# Patient Record
Sex: Female | Born: 1980 | Race: Black or African American | Hispanic: No | Marital: Single | Smoking: Former smoker
Health system: Southern US, Community
[De-identification: ages and names within clinical notes are randomized; demographics above are authoritative.]

## PROBLEM LIST (undated history)

## (undated) ENCOUNTER — Emergency Department: Admission: EM | Payer: Medicaid Other | Source: Home / Self Care

## (undated) DIAGNOSIS — F419 Anxiety disorder, unspecified: Secondary | ICD-10-CM

## (undated) DIAGNOSIS — E042 Nontoxic multinodular goiter: Secondary | ICD-10-CM

## (undated) DIAGNOSIS — I10 Essential (primary) hypertension: Secondary | ICD-10-CM

## (undated) DIAGNOSIS — D649 Anemia, unspecified: Secondary | ICD-10-CM

## (undated) DIAGNOSIS — J45909 Unspecified asthma, uncomplicated: Secondary | ICD-10-CM

## (undated) DIAGNOSIS — N3281 Overactive bladder: Secondary | ICD-10-CM

## (undated) DIAGNOSIS — E041 Nontoxic single thyroid nodule: Secondary | ICD-10-CM

## (undated) DIAGNOSIS — R3915 Urgency of urination: Secondary | ICD-10-CM

## (undated) HISTORY — PX: ABDOMINAL HYSTERECTOMY: SHX81

## (undated) HISTORY — PX: OTHER SURGICAL HISTORY: SHX169

---

## 2012-07-14 DIAGNOSIS — N9981 Other intraoperative complications of genitourinary system: Secondary | ICD-10-CM | POA: Insufficient documentation

## 2012-09-03 DIAGNOSIS — J45909 Unspecified asthma, uncomplicated: Secondary | ICD-10-CM | POA: Insufficient documentation

## 2015-05-09 DIAGNOSIS — F411 Generalized anxiety disorder: Secondary | ICD-10-CM | POA: Insufficient documentation

## 2015-05-09 DIAGNOSIS — I10 Essential (primary) hypertension: Secondary | ICD-10-CM | POA: Insufficient documentation

## 2017-08-01 DIAGNOSIS — Z6841 Body Mass Index (BMI) 40.0 and over, adult: Secondary | ICD-10-CM | POA: Insufficient documentation

## 2017-09-25 DIAGNOSIS — M654 Radial styloid tenosynovitis [de Quervain]: Secondary | ICD-10-CM | POA: Insufficient documentation

## 2018-06-25 DIAGNOSIS — M778 Other enthesopathies, not elsewhere classified: Secondary | ICD-10-CM | POA: Insufficient documentation

## 2018-12-04 ENCOUNTER — Emergency Department: Payer: Managed Care, Other (non HMO)

## 2018-12-04 DIAGNOSIS — J45909 Unspecified asthma, uncomplicated: Secondary | ICD-10-CM | POA: Insufficient documentation

## 2018-12-04 DIAGNOSIS — J029 Acute pharyngitis, unspecified: Secondary | ICD-10-CM | POA: Diagnosis present

## 2018-12-04 DIAGNOSIS — I10 Essential (primary) hypertension: Secondary | ICD-10-CM | POA: Diagnosis not present

## 2018-12-04 DIAGNOSIS — J208 Acute bronchitis due to other specified organisms: Secondary | ICD-10-CM | POA: Insufficient documentation

## 2018-12-04 DIAGNOSIS — J358 Other chronic diseases of tonsils and adenoids: Secondary | ICD-10-CM | POA: Insufficient documentation

## 2018-12-04 DIAGNOSIS — Z87891 Personal history of nicotine dependence: Secondary | ICD-10-CM | POA: Diagnosis not present

## 2018-12-04 LAB — CBC
HCT: 39.8 % (ref 36.0–46.0)
Hemoglobin: 13.1 g/dL (ref 12.0–15.0)
MCH: 30.5 pg (ref 26.0–34.0)
MCHC: 32.9 g/dL (ref 30.0–36.0)
MCV: 92.6 fL (ref 80.0–100.0)
Platelets: 203 10*3/uL (ref 150–400)
RBC: 4.3 MIL/uL (ref 3.87–5.11)
RDW: 12.1 % (ref 11.5–15.5)
WBC: 8 10*3/uL (ref 4.0–10.5)
nRBC: 0 % (ref 0.0–0.2)

## 2018-12-04 LAB — TROPONIN I: Troponin I: <0.03 ng/mL (ref <0.03)

## 2018-12-04 LAB — BASIC METABOLIC PANEL
Anion gap: 8 (ref 5–15)
BUN: 12 mg/dL (ref 6–20)
CO2: 24 mmol/L (ref 22–32)
Calcium: 8.7 mg/dL — ABNORMAL LOW (ref 8.9–10.3)
Chloride: 106 mmol/L (ref 98–111)
Creatinine, Ser: 0.68 mg/dL (ref 0.44–1.00)
GFR calc Af Amer: >60 mL/min (ref >60)
GFR calc non Af Amer: >60 mL/min (ref >60)
GLUCOSE: 104 mg/dL — AB (ref 70–99)
Potassium: 3.2 mmol/L — ABNORMAL LOW (ref 3.5–5.1)
Sodium: 138 mmol/L (ref 135–145)

## 2018-12-04 LAB — GROUP A STREP BY PCR: Group A Strep by PCR: NOT DETECTED

## 2018-12-04 MED ORDER — SODIUM CHLORIDE 0.9% FLUSH: 3.0000 mL | Freq: Once | INTRAVENOUS | Status: DC

## 2018-12-04 NOTE — ED Triage Notes (Signed)
Patient c/o sore throat and SOB beginning today.

## 2018-12-05 ENCOUNTER — Emergency Department
Admission: EM | Admit: 2018-12-05 | Discharge: 2018-12-05 | Disposition: A | Discharge status: Home / Self Care | Payer: Managed Care, Other (non HMO) | Attending: Emergency Medicine | Admitting: Emergency Medicine

## 2018-12-05 DIAGNOSIS — J208 Acute bronchitis due to other specified organisms: Secondary | ICD-10-CM | POA: Diagnosis not present

## 2018-12-05 DIAGNOSIS — J358 Other chronic diseases of tonsils and adenoids: Secondary | ICD-10-CM

## 2018-12-05 HISTORY — DX: Essential (primary) hypertension: I10

## 2018-12-05 HISTORY — DX: Unspecified asthma, uncomplicated: J45.909

## 2018-12-05 MED ORDER — ACETAMINOPHEN 500 MG PO TABS
1000.0000 mg | ORAL_TABLET | Freq: Once | ORAL | Status: AC
  Administered 2018-12-05: 1000 mg via ORAL
  Filled 2018-12-05: qty 2

## 2018-12-05 MED ORDER — ALBUTEROL SULFATE HFA 108 (90 BASE) MCG/ACT IN AERS: 2.0000 | INHALATION_SPRAY | Freq: Four times a day (QID) | RESPIRATORY_TRACT | 0 refills | Status: DC | PRN

## 2018-12-05 MED ORDER — IPRATROPIUM-ALBUTEROL 0.5-2.5 (3) MG/3ML IN SOLN
3.0000 mL | Freq: Once | RESPIRATORY_TRACT | Status: AC
  Administered 2018-12-05: 3 mL via RESPIRATORY_TRACT
  Filled 2018-12-05: qty 3

## 2018-12-05 MED ORDER — DEXAMETHASONE 10 MG/ML FOR PEDIATRIC ORAL USE
10.0000 mg | Freq: Once | INTRAMUSCULAR | Status: AC
  Administered 2018-12-05: 10 mg via ORAL
  Filled 2018-12-05: qty 1

## 2018-12-05 MED ORDER — OXYMETAZOLINE HCL 0.05 % NA SOLN
1.0000 | Freq: Once | NASAL | Status: AC
  Administered 2018-12-05: 1 via NASAL
  Filled 2018-12-05: qty 15

## 2018-12-05 NOTE — ED Provider Notes (Signed)
[facility] Emergency Department Provider Note  ____________________________________________   First MD Initiated Contact with Patient 12/05/18 0041     (approximate)  I have reviewed the triage vital signs and the nursing notes.   HISTORY  Chief Complaint Sore Throat and Shortness of Breath   HPI Mary Hicks is a 38 y.o. female who self presents to the emergency department with sore throat and shortness of breath that began gradually earlier today.  She is able to eat and drink although she does have some discomfort with swallowing.  No fevers or chills.  No chest pain.  Shortness of breath is mildly exertional and somewhat improved with rest.  She does have a dry cough.  She also has a history of asthma although has never been intubated.  She does not currently smoke.  No nasal congestion.  No ear pain.  Her symptoms are currently mild to moderate.    Past Medical History:  Diagnosis Date  . Asthma   . Hypertension     There are no active problems to display for this patient.   Past Surgical History:  Procedure Laterality Date  . ABDOMINAL HYSTERECTOMY    . CESAREAN SECTION      Prior to Admission medications   Medication Sig Start Date End Date Taking? Authorizing Provider  albuterol (PROVENTIL HFA;VENTOLIN HFA) 108 (90 Base) MCG/ACT inhaler Inhale 2 puffs into the lungs every 6 (six) hours as needed for wheezing or shortness of breath. 12/05/18   Merrily Brittleifenbark, Maricruz Lucero, MD    Allergies Patient has no known allergies.  No family history on file.  Social History Social History   Tobacco Use  . Smoking status: Former Games developermoker  . Smokeless tobacco: Never Used  Substance Use Topics  . Alcohol use: Yes  . Drug use: Not on file    Review of Systems Constitutional: No fever/chills Eyes: No visual changes. ENT: Positive for sore throat. Cardiovascular: Denies chest pain. Respiratory: Positive for shortness of breath. Gastrointestinal: No  abdominal pain.  No nausea, no vomiting.  No diarrhea.  No constipation. Genitourinary: Negative for dysuria. Musculoskeletal: Negative for back pain. Skin: Negative for rash. Neurological: Negative for headaches, focal weakness or numbness.   ____________________________________________   PHYSICAL EXAM:  VITAL SIGNS: ED Triage Vitals  Enc Vitals Group     BP 12/04/18 2052 (!) 174/99     Pulse Rate 12/04/18 2052 83     Resp 12/04/18 2052 17     Temp 12/04/18 2052 98.5 F (36.9 C)     Temp Source 12/04/18 2052 Oral     SpO2 12/04/18 2052 98 %     Weight 12/04/18 2053 240 lb (108.9 kg)     Height 12/04/18 2053 5' 7.5" (1.715 m)     Head Circumference --      Peak Flow --      Pain Score 12/04/18 2053 6     Pain Loc --      Pain Edu? --      Excl. in GC? --     Constitutional: Alert and oriented x4 speaks in full clear sentences no diaphoresis Eyes: PERRL EOMI. Head: Atraumatic. Nose: No congestion/rhinnorhea. Mouth/Throat: No trismus uvula midline no pharyngeal exudate mild erythema and she does have a tonsil stone on the left Neck: No stridor.  No anterior lymphadenopathy appreciated Cardiovascular: Normal rate, regular rhythm. Grossly normal heart sounds.  Good peripheral circulation. Respiratory: Normal respiratory effort.  No retractions.  Mild expiratory wheeze throughout although moving  good air Gastrointestinal: Soft nontender Musculoskeletal: No lower extremity edema   Neurologic:  Normal speech and language. No gross focal neurologic deficits are appreciated. Skin:  Skin is warm, dry and intact. No rash noted. Psychiatric: Mood and affect are normal. Speech and behavior are normal.    ____________________________________________   DIFFERENTIAL includes but not limited to  Bronchitis, viral pharyngitis, strep pharyngitis, retropharyngeal abscess, pneumonia, asthma exacerbation ____________________________________________   LABS (all labs ordered are  listed, but only abnormal results are displayed)  Labs Reviewed  BASIC METABOLIC PANEL - Abnormal; Notable for the following components:      Result Value   Potassium 3.2 (*)    Glucose, Bld 104 (*)    Calcium 8.7 (*)    All other components within normal limits  GROUP A STREP BY PCR  CBC  TROPONIN I    Lab work reviewed by me with no acute disease __________________________________________  EKG  ED ECG REPORT I, Merrily BrittleNeil Maniah Nading, the attending physician, personally viewed and interpreted this ECG.  Date: 12/07/2018 EKG Time:  Rate: 87 Rhythm: normal sinus rhythm QRS Axis: normal Intervals: normal ST/T Wave abnormalities: normal Narrative Interpretation: no evidence of acute ischemia  ____________________________________________  RADIOLOGY  Chest x-ray reviewed by me with no acute disease ____________________________________________   PROCEDURES  Procedure(s) performed: no  Procedures  Critical Care performed: no  ____________________________________________   INITIAL IMPRESSION / ASSESSMENT AND PLAN / ED COURSE  Pertinent labs & imaging results that were available during my care of the patient were reviewed by me and considered in my medical decision making (see chart for details).   As part of my medical decision making, I reviewed the following data within the electronic MEDICAL RECORD NUMBER History obtained from family if available, nursing notes, old chart and ekg, as well as notes from prior ED visits.  Patient comes to the emergency department with less than 24 hours of dry cough mild shortness of breath and sore throat.  Her lab work is reassuring and she is strep negative.  She is somewhat wheezy with a history of asthma and I have given her a breathing treatment along with Afrin to help with any congestion and postnasal drip with improvement in her symptoms.  I have also given her a dose of dexamethasone here to help with any mild asthma exacerbation versus  sore throat.  She feels significantly improved.  Will be discharged home with a refill for her albuterol and strict return precautions.      ____________________________________________   FINAL CLINICAL IMPRESSION(S) / ED DIAGNOSES  Final diagnoses:  Viral bronchitis  Tonsil stone      NEW MEDICATIONS STARTED DURING THIS VISIT:  Discharge Medication List as of 12/05/2018  1:16 AM    START taking these medications   Details  albuterol (PROVENTIL HFA;VENTOLIN HFA) 108 (90 Base) MCG/ACT inhaler Inhale 2 puffs into the lungs every 6 (six) hours as needed for wheezing or shortness of breath., Starting Fri 12/05/2018, Print         Note:  This document was prepared using Dragon voice recognition software and may include unintentional dictation errors.    Merrily Brittleifenbark, Mary Bolar, MD 12/07/18 2215

## 2018-12-05 NOTE — Discharge Instructions (Signed)
It's normal for you to be sick for 3-4 days with this illness.  Please take ibuprofen and tylenol as needed for fevers or pain and use your inhaler if you feel short of breath.    Return to the ED for any concerns.  It was a pleasure to take care of you today, and thank you for coming to our emergency department.  If you have any questions or concerns before leaving please ask the nurse to grab me and I'm more than happy to go through your aftercare instructions again.  If you have any concerns once you are home that you are not improving or are in fact getting worse before you can make it to your follow-up appointment, please do not hesitate to call [phone] and come back for further evaluation.  Merrily Brittle, MD  Results for orders placed or performed during the hospital encounter of 12/05/18  Group A Strep by PCR ([facility] Only)  Result Value Ref Range   Group A Strep by PCR NOT DETECTED NOT DETECTED  Basic metabolic panel  Result Value Ref Range   Sodium 138 135 - 145 mmol/L   Potassium 3.2 (L) 3.5 - 5.1 mmol/L   Chloride 106 98 - 111 mmol/L   CO2 24 22 - 32 mmol/L   Glucose, Bld 104 (H) 70 - 99 mg/dL   BUN 12 6 - 20 mg/dL   Creatinine, Ser 6.38 0.44 - 1.00 mg/dL   Calcium 8.7 (L) 8.9 - 10.3 mg/dL   GFR calc non Af Amer >60 >60 mL/min   GFR calc Af Amer >60 >60 mL/min   Anion gap 8 5 - 15  CBC  Result Value Ref Range   WBC 8.0 4.0 - 10.5 K/uL   RBC 4.30 3.87 - 5.11 MIL/uL   Hemoglobin 13.1 12.0 - 15.0 g/dL   HCT 93.7 34.2 - 87.6 %   MCV 92.6 80.0 - 100.0 fL   MCH 30.5 26.0 - 34.0 pg   MCHC 32.9 30.0 - 36.0 g/dL   RDW 81.1 57.2 - 62.0 %   Platelets 203 150 - 400 K/uL   nRBC 0.0 0.0 - 0.2 %  Troponin I - ONCE - STAT  Result Value Ref Range   Troponin I <0.03 <0.03 ng/mL   Dg Chest 2 View  Result Date: 12/04/2018 CLINICAL DATA:  38 year old female with shortness of breath. EXAM: CHEST - 2 VIEW COMPARISON:  None. FINDINGS: The heart size and mediastinal contours are within  normal limits. Both lungs are clear. The visualized skeletal structures are unremarkable. IMPRESSION: No active cardiopulmonary disease. Electronically Signed   By: Elgie Collard M.D.   On: 12/04/2018 21:21

## 2019-02-10 ENCOUNTER — Telehealth: Payer: Self-pay | Admitting: Emergency Medicine

## 2019-02-10 ENCOUNTER — Emergency Department
Admission: EM | Admit: 2019-02-10 | Discharge: 2019-02-10 | Disposition: A | Discharge status: Home / Self Care | Payer: Managed Care, Other (non HMO) | Attending: Emergency Medicine | Admitting: Emergency Medicine

## 2019-02-10 ENCOUNTER — Encounter: Payer: Self-pay | Admitting: Emergency Medicine

## 2019-02-10 DIAGNOSIS — Z87891 Personal history of nicotine dependence: Secondary | ICD-10-CM | POA: Insufficient documentation

## 2019-02-10 DIAGNOSIS — Z8709 Personal history of other diseases of the respiratory system: Secondary | ICD-10-CM

## 2019-02-10 DIAGNOSIS — J029 Acute pharyngitis, unspecified: Secondary | ICD-10-CM | POA: Insufficient documentation

## 2019-02-10 DIAGNOSIS — I10 Essential (primary) hypertension: Secondary | ICD-10-CM | POA: Diagnosis not present

## 2019-02-10 DIAGNOSIS — R062 Wheezing: Secondary | ICD-10-CM | POA: Diagnosis present

## 2019-02-10 DIAGNOSIS — J45909 Unspecified asthma, uncomplicated: Secondary | ICD-10-CM | POA: Diagnosis not present

## 2019-02-10 LAB — GROUP A STREP BY PCR: Group A Strep by PCR: DETECTED — AB

## 2019-02-10 MED ORDER — ALBUTEROL SULFATE HFA 108 (90 BASE) MCG/ACT IN AERS
2.0000 | INHALATION_SPRAY | Freq: Four times a day (QID) | RESPIRATORY_TRACT | Status: DC
  Administered 2019-02-10: 2 via RESPIRATORY_TRACT
  Filled 2019-02-10: qty 6.7

## 2019-02-10 MED ORDER — PENICILLIN G BENZATHINE 1200000 UNIT/2ML IM SUSP
2.4000 10*6.[IU] | Freq: Once | INTRAMUSCULAR | Status: AC
  Administered 2019-02-10: 2.4 10*6.[IU] via INTRAMUSCULAR
  Filled 2019-02-10: qty 4

## 2019-02-10 MED ORDER — CEFTRIAXONE SODIUM 250 MG IJ SOLR
250.0000 mg | Freq: Once | INTRAMUSCULAR | Status: AC
  Administered 2019-02-10: 250 mg via INTRAMUSCULAR
  Filled 2019-02-10: qty 250

## 2019-02-10 MED ORDER — PHENOL 1.4 % MT LIQD
1.0000 | OROMUCOSAL | Status: DC | PRN
  Administered 2019-02-10: 1 via OROMUCOSAL
  Filled 2019-02-10: qty 177

## 2019-02-10 MED ORDER — LIDOCAINE HCL (PF) 1 % IJ SOLN
INTRAMUSCULAR | Status: AC
  Administered 2019-02-10: 0.9 mL
  Filled 2019-02-10: qty 5

## 2019-02-10 MED ORDER — AZITHROMYCIN 500 MG PO TABS
1000.0000 mg | ORAL_TABLET | Freq: Once | ORAL | Status: AC
  Administered 2019-02-10: 1000 mg via ORAL
  Filled 2019-02-10: qty 2

## 2019-02-10 MED ORDER — MENTHOL 3 MG MT LOZG
1.0000 | LOZENGE | OROMUCOSAL | Status: DC | PRN
  Administered 2019-02-10: 3 mg via ORAL
  Filled 2019-02-10: qty 9

## 2019-02-10 NOTE — Telephone Encounter (Addendum)
Called patient to inform of positive strep a test.  She was treated with IM penicillin.  Phone is busy.   Will try one more time later. Tried again to call and still says busy.

## 2019-02-10 NOTE — Discharge Instructions (Signed)
Use the inhaler 2 puffs 4 times a day as needed the Cepacol lozenges you can take up to 3 times a day for the sore throat just suck on them and is Chloraseptic Spray follow the directions on the bottle sprayed in the back of your throat to help with the sore throat.  Please return here if you are worse or not any better in 2 or 3 days.

## 2019-02-10 NOTE — ED Provider Notes (Addendum)
[facility] [place] Emergency Department Provider Note   ____________________________________________   First MD Initiated Contact with Patient 02/10/19 0025     (approximate)  I have reviewed the triage vital signs and the nursing notes.   HISTORY  Chief Complaint Sore Throat   HPI Mary Hicks is a 38 y.o. female is in complaining of running out of her inhaler which is albuterol and having some trouble with wheezing and having a sore throat which is keeping her from sleeping.  On reviewing her old records I noticed that she had been treated for gonorrhea and syphilis last year.  I asked her if she had had any oral sex she said no which she said she had been with a gentleman since then he had not been treated since he had stage IV cancer and she needed treatment again.  I will give her the same treatment she had last time which was Bicillin 2,400,000 units IM with 250 Rocephin and a gram of Zithromax.  Additionally I will give her strep test and Cepacol lozenges and Chloraseptic spray.  I will also give her an albuterol inhaler.  Patient denies having had any oral exposure.         Past Medical History:  Diagnosis Date  . Asthma   . Hypertension     There are no active problems to display for this patient.   Past Surgical History:  Procedure Laterality Date  . ABDOMINAL HYSTERECTOMY    . CESAREAN SECTION      Prior to Admission medications   Medication Sig Start Date End Date Taking? Authorizing Provider  albuterol (PROVENTIL HFA;VENTOLIN HFA) 108 (90 Base) MCG/ACT inhaler Inhale 2 puffs into the lungs every 6 (six) hours as needed for wheezing or shortness of breath. 12/05/18   Merrily Brittle, MD    Allergies Patient has no known allergies.  History reviewed. No pertinent family history.  Social History Social History   Tobacco Use  . Smoking status: Former Games developer  . Smokeless tobacco: Never Used  Substance Use Topics  . Alcohol use: Yes   . Drug use: Not on file    Review of Systems  Constitutional: No fever/chills Eyes: No visual changes. ENT: No sore throat. Cardiovascular: Denies chest pain. Respiratory: Denies shortness of breath. Gastrointestinal: No abdominal pain.  No nausea, no vomiting.  No diarrhea.  No constipation. Genitourinary: Negative for dysuria. Musculoskeletal: Negative for back pain. Skin: Negative for rash. Psychiatry: Normal mood  ____________________________________________   PHYSICAL EXAM:  VITAL SIGNS: ED Triage Vitals [02/10/19 0023]  Enc Vitals Group     BP (!) 156/105     Pulse Rate 98     Resp 20     Temp 98.2 F (36.8 C)     Temp Source Oral     SpO2 100 %     Weight      Height      Head Circumference      Peak Flow      Pain Score      Pain Loc      Pain Edu?      Excl. in GC?     Constitutional: Alert and oriented. Well appearing and in no acute distress. Eyes: Conjunctivae are normal. PERRL. EOMI. Head: Atraumatic. Nose: No congestion/rhinnorhea. Mouth/Throat: Mucous membranes are moist.  Oropharynx mildly erythematous.  No pus I do not see any tonsil stones which patient complained of Neck: No stridor.  No cervical adenopathy no tenderness at all in the  neck  cardiovascular: Normal rate, regular rhythm. Grossly normal heart sounds.  Good peripheral circulation. Respiratory: Normal respiratory effort.  No retractions. Lungs CTAB. Gastrointestinal: Soft and nontender. No distention. No abdominal bruits. No CVA tenderness. Musculoskeletal: No lower extremity tenderness nor edema.   Neurologic:  Normal speech and language. No gross focal neurologic deficits are appreciated. No gait instability. Skin:  Skin is warm, dry and intact. No rash noted.   ____________________________________________   LABS (all labs ordered are listed, but only abnormal results are displayed)  Labs Reviewed - No data to display ____________________________________________  EKG    ____________________________________________  RADIOLOGY  ED MD interpretation:    Official radiology report(s): No results found.  ____________________________________________   PROCEDURES  Procedure(s) performed (including Critical Care):  Procedures   ____________________________________________   INITIAL IMPRESSION / ASSESSMENT AND PLAN / ED COURSE  Advised patient to get her partner treated.  She has no wheezing now the albuterol should help in the future.               ____________________________________________   FINAL CLINICAL IMPRESSION(S) / ED DIAGNOSES  Final diagnoses:  History of asthma  Pharyngitis, unspecified etiology     ED Discharge Orders    None       Note:  This document was prepared using Dragon voice recognition software and may include unintentional dictation errors.    Arnaldo Natal, MD 02/10/19 7915    Arnaldo Natal, MD 02/10/19 [phone]

## 2019-02-10 NOTE — ED Triage Notes (Signed)
Pt c/o sore throat and wheezing x3 days. Pt sts she has tonsil stones and they make her tonsils swell, causing her throat to hurt. Pt out of inhaler and reports wheezing. No wheezing on assessment.

## 2019-03-28 ENCOUNTER — Other Ambulatory Visit: Payer: Self-pay

## 2019-03-28 ENCOUNTER — Encounter: Payer: Self-pay | Admitting: Emergency Medicine

## 2019-03-28 ENCOUNTER — Emergency Department
Admission: EM | Admit: 2019-03-28 | Discharge: 2019-03-28 | Disposition: A | Discharge status: Home / Self Care | Payer: Medicaid Other | Attending: Emergency Medicine | Admitting: Emergency Medicine

## 2019-03-28 DIAGNOSIS — J358 Other chronic diseases of tonsils and adenoids: Secondary | ICD-10-CM | POA: Diagnosis not present

## 2019-03-28 DIAGNOSIS — Z202 Contact with and (suspected) exposure to infections with a predominantly sexual mode of transmission: Secondary | ICD-10-CM | POA: Diagnosis not present

## 2019-03-28 LAB — CHLAMYDIA/NGC RT PCR (ARMC ONLY)
Chlamydia Tr: NOT DETECTED
N gonorrhoeae: NOT DETECTED

## 2019-03-28 LAB — URINALYSIS, COMPLETE (UACMP) WITH MICROSCOPIC
Bacteria, UA: NONE SEEN
Bilirubin Urine: NEGATIVE
Glucose, UA: NEGATIVE mg/dL
Ketones, ur: NEGATIVE mg/dL
Leukocytes,Ua: NEGATIVE
Nitrite: NEGATIVE
Protein, ur: NEGATIVE mg/dL
Specific Gravity, Urine: 1.017 (ref 1.005–1.030)
pH: 6 (ref 5.0–8.0)

## 2019-03-28 LAB — WET PREP, GENITAL
Clue Cells Wet Prep HPF POC: NONE SEEN
Trich, Wet Prep: NONE SEEN
Yeast Wet Prep HPF POC: NONE SEEN

## 2019-03-28 MED ORDER — FLUCONAZOLE 150 MG PO TABS: ORAL_TABLET | ORAL | 0 refills | Status: DC

## 2019-03-28 MED ORDER — AZITHROMYCIN 500 MG PO TABS
1000.0000 mg | ORAL_TABLET | Freq: Once | ORAL | Status: AC
  Administered 2019-03-28: 1000 mg via ORAL
  Filled 2019-03-28: qty 2

## 2019-03-28 MED ORDER — METRONIDAZOLE 500 MG PO TABS
2000.0000 mg | ORAL_TABLET | Freq: Once | ORAL | Status: AC
  Administered 2019-03-28: 2000 mg via ORAL
  Filled 2019-03-28: qty 4

## 2019-03-28 MED ORDER — CEFTRIAXONE SODIUM 250 MG IJ SOLR
250.0000 mg | Freq: Once | INTRAMUSCULAR | Status: AC
  Administered 2019-03-28: 250 mg via INTRAMUSCULAR
  Filled 2019-03-28: qty 250

## 2019-03-28 MED ORDER — PENICILLIN G BENZATHINE 1200000 UNIT/2ML IM SUSP
2.4000 10*6.[IU] | Freq: Once | INTRAMUSCULAR | Status: AC
  Administered 2019-03-28: 2.4 10*6.[IU] via INTRAMUSCULAR
  Filled 2019-03-28: qty 4

## 2019-03-28 MED ORDER — LIDOCAINE HCL (PF) 1 % IJ SOLN
INTRAMUSCULAR | Status: AC
  Administered 2019-03-28: 5 mL
  Filled 2019-03-28: qty 5

## 2019-03-28 NOTE — ED Notes (Signed)
Patient reports her partner told her he has syphilis, ghohnorrea, and chlamydia. Patient c/o foul smelling discharge. Also report she has tonsil stones and is here for antibiotics to treat them

## 2019-03-28 NOTE — Discharge Instructions (Addendum)
Follow-up with [facility] department.  If your syphilis test returns as positive you will need further evaluation.  For the tonsil stones follow-up with [facility].  Please call them for an appointment.  You should not have sex for at least 7 to 10 days.  Please use protection after that.  If any other partners are involved they should be notified.

## 2019-03-28 NOTE — ED Provider Notes (Signed)
[facility] Emergency Department Provider Note  ____________________________________________   First MD Initiated Contact with Patient 03/28/19 [phone]     (approximate)  I have reviewed the triage vital signs and the nursing notes.   HISTORY  Chief Complaint Sore Throat    HPI Mary Hicks is a 38 y.o. female presents emergency department complaining of STD exposure.  She states she was diagnosed at the health department with syphilis, gonorrhea, chlamydia, and trichomoniasis.  She states that was approximately 1 year ago but her partner never got treated and she is becoming symptomatic again.  She denies any fever or chills.  No pelvic pain.    Past Medical History:  Diagnosis Date  . Asthma   . Hypertension     There are no active problems to display for this patient.   Past Surgical History:  Procedure Laterality Date  . ABDOMINAL HYSTERECTOMY    . CESAREAN SECTION      Prior to Admission medications   Medication Sig Start Date End Date Taking? Authorizing Provider  albuterol (PROVENTIL HFA;VENTOLIN HFA) 108 (90 Base) MCG/ACT inhaler Inhale 2 puffs into the lungs every 6 (six) hours as needed for wheezing or shortness of breath. 12/05/18   Merrily Brittle, MD  fluconazole (DIFLUCAN) 150 MG tablet Take one now and one in a week 03/28/19   Sherrie Mustache Roselyn Bering, PA-C    Allergies Patient has no known allergies.  No family history on file.  Social History Social History   Tobacco Use  . Smoking status: Former Games developer  . Smokeless tobacco: Never Used  Substance Use Topics  . Alcohol use: Yes  . Drug use: Not on file    Review of Systems  Constitutional: No fever/chills Eyes: No visual changes. ENT: No sore throat. Respiratory: Denies cough Genitourinary: Negative for dysuria.  Positive STD exposure Musculoskeletal: Negative for back pain. Skin: Negative for rash.    ____________________________________________   PHYSICAL EXAM:  VITAL SIGNS: ED Triage Vitals  Enc Vitals Group     BP 03/28/19 0913 (!) 153/97     Pulse Rate 03/28/19 0913 73     Resp 03/28/19 0913 18     Temp 03/28/19 0913 98.4 F (36.9 C)     Temp Source 03/28/19 0913 Oral     SpO2 03/28/19 0913 100 %     Weight 03/28/19 0917 240 lb (108.9 kg)     Height 03/28/19 0917 5\' 7"  (1.702 m)     Head Circumference --      Peak Flow --      Pain Score 03/28/19 0917 6     Pain Loc --      Pain Edu? --      Excl. in GC? --     Constitutional: Alert and oriented. Well appearing and in no acute distress. Eyes: Conjunctivae are normal.  Head: Atraumatic. Nose: No congestion/rhinnorhea. Mouth/Throat: Mucous membranes are moist.   Neck:  supple no lymphadenopathy noted Cardiovascular: Normal rate, regular rhythm. Heart sounds are normal Respiratory: Normal respiratory effort.  No retractions, lungs c t a  Abd: soft nontender bs normal all 4 quad GU: Vaginal exam shows a thick white discharge, multiple folds noted in the vaginal vault which made exam difficult Musculoskeletal: FROM all extremities, warm and well perfused Neurologic:  Normal speech and language.  Skin:  Skin is warm, dry and intact. No rash noted. Psychiatric: Mood and affect are normal. Speech and behavior are normal.  ____________________________________________   LABS (all labs  ordered are listed, but only abnormal results are displayed)  Labs Reviewed  WET PREP, GENITAL - Abnormal; Notable for the following components:      Result Value   WBC, Wet Prep HPF POC RARE (*)    All other components within normal limits  CHLAMYDIA/NGC RT PCR ([facility] ONLY)  RPR  URINALYSIS, COMPLETE (UACMP) WITH MICROSCOPIC   ____________________________________________   ____________________________________________  RADIOLOGY    ____________________________________________   PROCEDURES  Procedure(s) performed: Rocephin 250 mg IM, penicillin 2,400,000 units, Zithromax 1 g  p.o., metronidazole 2 g p.o.   Procedures    ____________________________________________   INITIAL IMPRESSION / ASSESSMENT AND PLAN / ED COURSE  Pertinent labs & imaging results that were available during my care of the patient were reviewed by me and considered in my medical decision making (see chart for details).   Patient is 38 year old female presents emergency department with STD exposure.  Physical exam patient appears well.  Multiple folds noted in the vaginal vault which makes the exam difficult.  Swabs were obtained.  Wet prep returns with a few white blood cells.  Patient was empirically treated with Rocephin 250 mg IM, penicillin 2,400,000 units, Zithromax 1 g p.o., metronidazole 2 g.  Explained to her that her testing will not be back for couple of days.  She is [facility].  If her syphilis test is still positive she will need additional evaluation.  She states she understands will comply.  She was discharged in stable condition.     As part of my medical decision making, I reviewed the following data within the electronic MEDICAL RECORD NUMBER Nursing notes reviewed and incorporated, Labs reviewed wet prep shows rare WBCs and Old chart reviewed  ____________________________________________   FINAL CLINICAL IMPRESSION(S) / ED DIAGNOSES  Final diagnoses:  STD exposure  Tonsil stone      NEW MEDICATIONS STARTED DURING THIS VISIT:  Discharge Medication List as of 03/28/2019 10:58 AM    START taking these medications   Details  fluconazole (DIFLUCAN) 150 MG tablet Take one now and one in a week, Normal         Note:  This document was prepared using Dragon voice recognition software and may include unintentional dictation errors.    Faythe GheeFisher, Mackay Hanauer W, PA-C 03/28/19 1127    Dionne BucySiadecki, Sebastian, MD 03/28/19 [phone]

## 2019-03-28 NOTE — ED Triage Notes (Signed)
States sore throat L side, states thinks has painful "tonsil stones"

## 2019-03-30 LAB — RPR: RPR Ser Ql: NONREACTIVE

## 2019-06-18 ENCOUNTER — Other Ambulatory Visit: Payer: Self-pay

## 2019-06-18 ENCOUNTER — Emergency Department
Admission: EM | Admit: 2019-06-18 | Discharge: 2019-06-18 | Disposition: A | Discharge status: Home / Self Care | Payer: Medicaid Other | Attending: Emergency Medicine | Admitting: Emergency Medicine

## 2019-06-18 DIAGNOSIS — H6592 Unspecified nonsuppurative otitis media, left ear: Secondary | ICD-10-CM | POA: Diagnosis not present

## 2019-06-18 DIAGNOSIS — Z87891 Personal history of nicotine dependence: Secondary | ICD-10-CM | POA: Diagnosis not present

## 2019-06-18 DIAGNOSIS — R070 Pain in throat: Secondary | ICD-10-CM | POA: Insufficient documentation

## 2019-06-18 DIAGNOSIS — I10 Essential (primary) hypertension: Secondary | ICD-10-CM | POA: Insufficient documentation

## 2019-06-18 DIAGNOSIS — H60502 Unspecified acute noninfective otitis externa, left ear: Secondary | ICD-10-CM | POA: Insufficient documentation

## 2019-06-18 DIAGNOSIS — J45909 Unspecified asthma, uncomplicated: Secondary | ICD-10-CM | POA: Insufficient documentation

## 2019-06-18 DIAGNOSIS — H9202 Otalgia, left ear: Secondary | ICD-10-CM | POA: Diagnosis present

## 2019-06-18 LAB — CBC WITH DIFFERENTIAL/PLATELET
Abs Immature Granulocytes: 0.01 10*3/uL (ref 0.00–0.07)
Basophils Absolute: 0 10*3/uL (ref 0.0–0.1)
Basophils Relative: 1 %
Eosinophils Absolute: 0.1 10*3/uL (ref 0.0–0.5)
Eosinophils Relative: 2 %
HCT: 35.7 % — ABNORMAL LOW (ref 36.0–46.0)
Hemoglobin: 12.1 g/dL (ref 12.0–15.0)
Immature Granulocytes: 0 %
Lymphocytes Relative: 38 %
Lymphs Abs: 2.5 10*3/uL (ref 0.7–4.0)
MCH: 31.1 pg (ref 26.0–34.0)
MCHC: 33.9 g/dL (ref 30.0–36.0)
MCV: 91.8 fL (ref 80.0–100.0)
Monocytes Absolute: 0.5 10*3/uL (ref 0.1–1.0)
Monocytes Relative: 8 %
Neutro Abs: 3.4 10*3/uL (ref 1.7–7.7)
Neutrophils Relative %: 51 %
Platelets: 190 10*3/uL (ref 150–400)
RBC: 3.89 MIL/uL (ref 3.87–5.11)
RDW: 12 % (ref 11.5–15.5)
WBC: 6.6 10*3/uL (ref 4.0–10.5)
nRBC: 0 % (ref 0.0–0.2)

## 2019-06-18 LAB — COMPREHENSIVE METABOLIC PANEL
ALT: 15 U/L (ref 0–44)
AST: 19 U/L (ref 15–41)
Albumin: 4 g/dL (ref 3.5–5.0)
Alkaline Phosphatase: 68 U/L (ref 38–126)
Anion gap: 7 (ref 5–15)
BUN: 17 mg/dL (ref 6–20)
CO2: 26 mmol/L (ref 22–32)
Calcium: 8.8 mg/dL — ABNORMAL LOW (ref 8.9–10.3)
Chloride: 106 mmol/L (ref 98–111)
Creatinine, Ser: 0.83 mg/dL (ref 0.44–1.00)
GFR calc Af Amer: >60 mL/min (ref >60)
GFR calc non Af Amer: >60 mL/min (ref >60)
Glucose, Bld: 117 mg/dL — ABNORMAL HIGH (ref 70–99)
Potassium: 3.6 mmol/L (ref 3.5–5.1)
Sodium: 139 mmol/L (ref 135–145)
Total Bilirubin: 0.6 mg/dL (ref 0.3–1.2)
Total Protein: 7.5 g/dL (ref 6.5–8.1)

## 2019-06-18 MED ORDER — LORATADINE 10 MG PO TABS: 10.0000 mg | ORAL_TABLET | Freq: Every day | ORAL | Status: DC

## 2019-06-18 MED ORDER — FLUTICASONE PROPIONATE 50 MCG/ACT NA SUSP: 1.0000 | Freq: Every day | NASAL | 2 refills | Status: DC

## 2019-06-18 MED ORDER — CETIRIZINE HCL 10 MG PO TABS: 10.0000 mg | ORAL_TABLET | Freq: Every day | ORAL | 0 refills | Status: DC

## 2019-06-18 MED ORDER — CIPRODEX 0.3-0.1 % OT SUSP: 4.0000 [drp] | Freq: Two times a day (BID) | OTIC | 0 refills | Status: AC

## 2019-06-18 MED ORDER — ACETAMINOPHEN 325 MG PO TABS
650.0000 mg | ORAL_TABLET | Freq: Once | ORAL | Status: AC
  Administered 2019-06-18: 23:00:00 650 mg via ORAL
  Filled 2019-06-18: qty 2

## 2019-06-18 NOTE — ED Triage Notes (Signed)
Patient c/o sore throat/swelling and left ear pain. Patient reports hx of tonsil stones. Patient reports that it is becoming hard to swallow. Patient reports difficulty sleeping due to pain.

## 2019-06-18 NOTE — ED Provider Notes (Signed)
[facility] Emergency Department Provider Note  ____________________________________________  Time seen: Approximately 10:38 PM  I have reviewed the triage vital signs and the nursing notes.   HISTORY  Chief Complaint Sore Throat and Otalgia    HPI Mary Hicks is a 38 y.o. female presents to the emergency department with left ear pain that is worse with swallowing.  Patient states that she has been able to swallow at home and has been managing her own secretions.  She states that her left ear throbs but she has not noticed any discharge from the left ear or changes in hearing.  She denies fever or chills at home.  Patient states that she has experienced similar symptoms in the past.  No rhinorrhea, nasal congestion or cough.  No other alleviating measures have been attempted.        Past Medical History:  Diagnosis Date  . Asthma   . Hypertension     There are no active problems to display for this patient.   Past Surgical History:  Procedure Laterality Date  . ABDOMINAL HYSTERECTOMY    . CESAREAN SECTION      Prior to Admission medications   Medication Sig Start Date End Date Taking? Authorizing Provider  albuterol (PROVENTIL HFA;VENTOLIN HFA) 108 (90 Base) MCG/ACT inhaler Inhale 2 puffs into the lungs every 6 (six) hours as needed for wheezing or shortness of breath. 12/05/18   Merrily Brittleifenbark, Neil, MD  cetirizine (ZYRTEC ALLERGY) 10 MG tablet Take 1 tablet (10 mg total) by mouth daily for 10 days. 06/18/19 06/28/19  Orvil FeilWoods, Jaclyn M, PA-C  ciprofloxacin-dexamethasone (CIPRODEX) OTIC suspension Place 4 drops into the left ear 2 (two) times daily for 7 days. 06/18/19 06/25/19  Orvil FeilWoods, Jaclyn M, PA-C  fluconazole (DIFLUCAN) 150 MG tablet Take one now and one in a week 03/28/19   Sherrie MustacheFisher, Roselyn BeringSusan W, PA-C  fluticasone [facility](FLONASE) 50 MCG/ACT nasal spray Place 1 spray into both nostrils daily. 06/18/19 06/17/20  Orvil FeilWoods, Jaclyn M, PA-C    Allergies Patient has no known  allergies.  No family history on file.  Social History Social History   Tobacco Use  . Smoking status: Former Games developermoker  . Smokeless tobacco: Never Used  Substance Use Topics  . Alcohol use: Yes  . Drug use: Not on file     Review of Systems  Constitutional: No fever/chills Eyes: No visual changes. No discharge ENT: Patient has left ear pain.  Cardiovascular: no chest pain. Respiratory: no cough. No SOB. Gastrointestinal: No abdominal pain.  No nausea, no vomiting.  No diarrhea.  No constipation. Musculoskeletal: Negative for musculoskeletal pain. Skin: Negative for rash, abrasions, lacerations, ecchymosis. Neurological: Negative for headaches, focal weakness or numbness.   ____________________________________________   PHYSICAL EXAM:  VITAL SIGNS: ED Triage Vitals  Enc Vitals Group     BP 06/18/19 2147 (!) 154/99     Pulse Rate 06/18/19 2147 74     Resp 06/18/19 2147 18     Temp 06/18/19 2147 98.6 F (37 C)     Temp Source 06/18/19 2147 Oral     SpO2 06/18/19 2147 100 %     Weight 06/18/19 2149 252 lb (114.3 kg)     Height 06/18/19 2149 5\' 7"  (1.702 m)     Head Circumference --      Peak Flow --      Pain Score 06/18/19 2149 8     Pain Loc --      Pain Edu? --  Excl. in [place]? --      Constitutional: Alert and oriented. Well appearing and in no acute distress. Eyes: Conjunctivae are normal. PERRL. EOMI. Head: Atraumatic. ENT:      Ears: Left tympanic membrane is effused.  Patient has reproducible tenderness with palpation of the left tragus.      Nose: No congestion/rhinnorhea.      Mouth/Throat: Mucous membranes are moist.  Neck: No stridor.  No cervical spine tenderness to palpation. Cardiovascular: Normal rate, regular rhythm. Normal S1 and S2.  Good peripheral circulation. Respiratory: Normal respiratory effort without tachypnea or retractions. Lungs CTAB. Good air entry to the bases with no decreased or absent breath sounds. Gastrointestinal: Bowel  sounds 4 quadrants. Soft and nontender to palpation. No guarding or rigidity. No palpable masses. No distention. No CVA tenderness. Musculoskeletal: Full range of motion to all extremities. No gross deformities appreciated. Neurologic:  Normal speech and language. No gross focal neurologic deficits are appreciated.  Skin:  Skin is warm, dry and intact. No rash noted. Psychiatric: Mood and affect are normal. Speech and behavior are normal. Patient exhibits appropriate insight and judgement.   ____________________________________________   LABS (all labs ordered are listed, but only abnormal results are displayed)  Labs Reviewed  COMPREHENSIVE METABOLIC PANEL - Abnormal; Notable for the following components:      Result Value   Glucose, Bld 117 (*)    Calcium 8.8 (*)    All other components within normal limits  CBC WITH DIFFERENTIAL/PLATELET - Abnormal; Notable for the following components:   HCT 35.7 (*)    All other components within normal limits   ____________________________________________  EKG   ____________________________________________  RADIOLOGY   No results found.  ____________________________________________    PROCEDURES  Procedure(s) performed:    Procedures    Medications  acetaminophen (TYLENOL) tablet 650 mg (has no administration in time range)  loratadine (CLARITIN) tablet 10 mg (has no administration in time range)     ____________________________________________   INITIAL IMPRESSION / ASSESSMENT AND PLAN / ED COURSE  Pertinent labs & imaging results that were available during my care of the patient were reviewed by me and considered in my medical decision making (see chart for details).  Review of the [place] CSRS was performed in accordance of the [place] prior to dispensing any controlled drugs.           Assessment and plan:  Otalgia 38 year old female presents to the emergency department with left ear pain for the past several  days.  On physical exam, left TM is effused.  Patient has reproducible tenderness with palpation of the left tragus.  Physical exam findings are consistent with middle ear effusion and otitis externa.  Patient was discharged with Zyrtec, Flonase and Ciprodex.  Patient was advised to follow-up with primary care as needed.  All patient questions were answered.   ____________________________________________  FINAL CLINICAL IMPRESSION(S) / ED DIAGNOSES  Final diagnoses:  Fluid level behind tympanic membrane of left ear  Acute otitis externa of left ear, unspecified type      NEW MEDICATIONS STARTED DURING THIS VISIT:  ED Discharge Orders         Ordered    ciprofloxacin-dexamethasone (CIPRODEX) OTIC suspension  2 times daily     06/18/19 2231    fluticasone (FLONASE) 50 MCG/ACT nasal spray  Daily     06/18/19 2231    cetirizine (ZYRTEC ALLERGY) 10 MG tablet  Daily     06/18/19 2231  This chart was dictated using voice recognition software/Dragon. Despite best efforts to proofread, errors can occur which can change the meaning. Any change was purely unintentional.    Orvil FeilWoods, Jaclyn M, PA-C 06/18/19 2248    Concha SeFunke, Mary E, MD 06/19/19 1414

## 2019-10-13 ENCOUNTER — Ambulatory Visit: Payer: Medicaid Other

## 2019-11-24 ENCOUNTER — Emergency Department
Admission: EM | Admit: 2019-11-24 | Discharge: 2019-11-24 | Disposition: A | Discharge status: Home / Self Care | Payer: Medicaid Other | Attending: Emergency Medicine | Admitting: Emergency Medicine

## 2019-11-24 ENCOUNTER — Other Ambulatory Visit: Payer: Self-pay

## 2019-11-24 ENCOUNTER — Emergency Department: Payer: Medicaid Other

## 2019-11-24 ENCOUNTER — Encounter: Payer: Self-pay | Admitting: Emergency Medicine

## 2019-11-24 DIAGNOSIS — R1011 Right upper quadrant pain: Secondary | ICD-10-CM | POA: Diagnosis not present

## 2019-11-24 DIAGNOSIS — R110 Nausea: Secondary | ICD-10-CM | POA: Diagnosis not present

## 2019-11-24 DIAGNOSIS — Z113 Encounter for screening for infections with a predominantly sexual mode of transmission: Secondary | ICD-10-CM | POA: Diagnosis not present

## 2019-11-24 DIAGNOSIS — Z202 Contact with and (suspected) exposure to infections with a predominantly sexual mode of transmission: Secondary | ICD-10-CM | POA: Diagnosis not present

## 2019-11-24 DIAGNOSIS — R0789 Other chest pain: Secondary | ICD-10-CM | POA: Diagnosis present

## 2019-11-24 DIAGNOSIS — I10 Essential (primary) hypertension: Secondary | ICD-10-CM | POA: Insufficient documentation

## 2019-11-24 DIAGNOSIS — Z87891 Personal history of nicotine dependence: Secondary | ICD-10-CM | POA: Diagnosis not present

## 2019-11-24 DIAGNOSIS — Z114 Encounter for screening for human immunodeficiency virus [HIV]: Secondary | ICD-10-CM | POA: Insufficient documentation

## 2019-11-24 LAB — CBC
HCT: 37.5 % (ref 36.0–46.0)
Hemoglobin: 12.4 g/dL (ref 12.0–15.0)
MCH: 30.5 pg (ref 26.0–34.0)
MCHC: 33.1 g/dL (ref 30.0–36.0)
MCV: 92.4 fL (ref 80.0–100.0)
Platelets: 201 10*3/uL (ref 150–400)
RBC: 4.06 MIL/uL (ref 3.87–5.11)
RDW: 12.4 % (ref 11.5–15.5)
WBC: 5.3 10*3/uL (ref 4.0–10.5)
nRBC: 0 % (ref 0.0–0.2)

## 2019-11-24 LAB — BASIC METABOLIC PANEL
Anion gap: 7 (ref 5–15)
BUN: 13 mg/dL (ref 6–20)
CO2: 26 mmol/L (ref 22–32)
Calcium: 8.8 mg/dL — ABNORMAL LOW (ref 8.9–10.3)
Chloride: 105 mmol/L (ref 98–111)
Creatinine, Ser: 0.71 mg/dL (ref 0.44–1.00)
GFR calc Af Amer: >60 mL/min (ref >60)
GFR calc non Af Amer: >60 mL/min (ref >60)
Glucose, Bld: 113 mg/dL — ABNORMAL HIGH (ref 70–99)
Potassium: 3.6 mmol/L (ref 3.5–5.1)
Sodium: 138 mmol/L (ref 135–145)

## 2019-11-24 LAB — URINALYSIS, COMPLETE (UACMP) WITH MICROSCOPIC
Bacteria, UA: NONE SEEN
Bilirubin Urine: NEGATIVE
Glucose, UA: NEGATIVE mg/dL
Hgb urine dipstick: NEGATIVE
Ketones, ur: NEGATIVE mg/dL
Leukocytes,Ua: NEGATIVE
Nitrite: NEGATIVE
Protein, ur: NEGATIVE mg/dL
Specific Gravity, Urine: 1.016 (ref 1.005–1.030)
pH: 8 (ref 5.0–8.0)

## 2019-11-24 LAB — WET PREP, GENITAL
Sperm: NONE SEEN
Trich, Wet Prep: NONE SEEN
Yeast Wet Prep HPF POC: NONE SEEN

## 2019-11-24 LAB — TROPONIN I (HIGH SENSITIVITY): Troponin I (High Sensitivity): <2 ng/L (ref <18)

## 2019-11-24 LAB — RAPID HIV SCREEN (HIV 1/2 AB+AG)
HIV 1/2 Antibodies: NONREACTIVE
HIV-1 P24 Antigen - HIV24: NONREACTIVE

## 2019-11-24 MED ORDER — FLUCONAZOLE 150 MG PO TABS: 150.0000 mg | ORAL_TABLET | Freq: Every day | ORAL | 0 refills | Status: DC

## 2019-11-24 MED ORDER — PANTOPRAZOLE SODIUM 20 MG PO TBEC: 20.0000 mg | DELAYED_RELEASE_TABLET | Freq: Every day | ORAL | 0 refills | Status: DC

## 2019-11-24 MED ORDER — ONDANSETRON 4 MG PO TBDP: 4.0000 mg | ORAL_TABLET | Freq: Three times a day (TID) | ORAL | 0 refills | Status: DC | PRN

## 2019-11-24 MED ORDER — CEFTRIAXONE SODIUM 250 MG IJ SOLR
250.0000 mg | Freq: Once | INTRAMUSCULAR | Status: AC
  Administered 2019-11-24: 250 mg via INTRAMUSCULAR
  Filled 2019-11-24: qty 250

## 2019-11-24 MED ORDER — AZITHROMYCIN 1 G PO PACK: 1.0000 g | PACK | Freq: Once | ORAL | Status: DC

## 2019-11-24 MED ORDER — LIDOCAINE VISCOUS HCL 2 % MT SOLN: 15.0000 mL | Freq: Once | OROMUCOSAL | Status: DC

## 2019-11-24 MED ORDER — AZITHROMYCIN 500 MG PO TABS
1000.0000 mg | ORAL_TABLET | Freq: Once | ORAL | Status: AC
  Administered 2019-11-24: 1000 mg via ORAL
  Filled 2019-11-24: qty 2

## 2019-11-24 MED ORDER — ALUM & MAG HYDROXIDE-SIMETH 200-200-20 MG/5ML PO SUSP: 30.0000 mL | Freq: Once | ORAL | Status: DC

## 2019-11-24 NOTE — ED Triage Notes (Signed)
Pt reports intermittent sharp chest pain to her mid chest, mid epigastric area for the past few days that get worse after she eats.

## 2019-11-24 NOTE — ED Provider Notes (Signed)
[facility] Emergency Department Provider Note  ____________________________________________   First MD Initiated Contact with Patient 11/24/19 1021     (approximate)  I have reviewed the triage vital signs and the nursing notes.   HISTORY  Chief Complaint Chest Pain and Nausea    HPI Mary Hicks is a 39 y.o. female with below list of previous medical conditions presents to the emergency department secondary to a 3-day history of right upper quadrant abdominal pain with associated nausea and vomiting following eating.  Patient also admits to being notified by sexual partner that he did positive for STD.  Patient states that she was sexually active with this person a week ago and starting 4 days ago she started having dysuria with vaginal discharge.        Past Medical History:  Diagnosis Date  . Asthma   . Hypertension     There are no problems to display for this patient.   Past Surgical History:  Procedure Laterality Date  . ABDOMINAL HYSTERECTOMY    . CESAREAN SECTION      Prior to Admission medications   Medication Sig Start Date End Date Taking? Authorizing Provider  albuterol (PROVENTIL HFA;VENTOLIN HFA) 108 (90 Base) MCG/ACT inhaler Inhale 2 puffs into the lungs every 6 (six) hours as needed for wheezing or shortness of breath. 12/05/18   Merrily Brittle, MD  cetirizine (ZYRTEC ALLERGY) 10 MG tablet Take 1 tablet (10 mg total) by mouth daily for 10 days. 06/18/19 06/28/19  Orvil Feil, PA-C  fluconazole (DIFLUCAN) 150 MG tablet Take one now and one in a week 03/28/19   Sherrie Mustache, Roselyn Bering, PA-C  fluticasone [facility]) 50 MCG/ACT nasal spray Place 1 spray into both nostrils daily. 06/18/19 06/17/20  Orvil Feil, PA-C    Allergies Patient has no known allergies.  No family history on file.  Social History Social History   Tobacco Use  . Smoking status: Former Games developer  . Smokeless tobacco: Never Used  Substance Use Topics  .  Alcohol use: Yes  . Drug use: Not on file    Review of Systems Constitutional: No fever/chills Eyes: No visual changes. ENT: No sore throat. Cardiovascular: Denies chest pain. Respiratory: Denies shortness of breath. Gastrointestinal: Positive for abdominal pain nausea and vomiting Genitourinary: Positive for dysuria and vaginal discharge Musculoskeletal: Negative for neck pain.  Negative for back pain. Integumentary: Negative for rash. Neurological: Negative for headaches, focal weakness or numbness.   ____________________________________________   PHYSICAL EXAM:  VITAL SIGNS: ED Triage Vitals  Enc Vitals Group     BP 11/24/19 0923 (!) 158/95     Pulse Rate 11/24/19 0923 75     Resp 11/24/19 0923 18     Temp 11/24/19 0923 98.9 F (37.2 C)     Temp Source 11/24/19 0923 Oral     SpO2 11/24/19 0923 99 %     Weight 11/24/19 0858 108.9 kg (240 lb)     Height 11/24/19 0858 1.702 m (5\' 7" )     Head Circumference --      Peak Flow --      Pain Score 11/24/19 0858 4     Pain Loc --      Pain Edu? --      Excl. in [place]? --     Constitutional: Alert and oriented.  Eyes: Conjunctivae are normal.  Mouth/Throat: Patient is wearing a mask. Neck: No stridor.  No meningeal signs.   Cardiovascular: Normal rate, regular rhythm. Good peripheral  circulation. Grossly normal heart sounds. Respiratory: Normal respiratory effort.  No retractions. Gastrointestinal: Soft and nontender. No distention.  Genitourinary: Yellowish-white vaginal discharge Musculoskeletal: No lower extremity tenderness nor edema. No gross deformities of extremities. Neurologic:  Normal speech and language. No gross focal neurologic deficits are appreciated.  Skin:  Skin is warm, dry and intact. Psychiatric: Mood and affect are normal. Speech and behavior are normal.  ____________________________________________   LABS (all labs ordered are listed, but only abnormal results are displayed)  Labs Reviewed    BASIC METABOLIC PANEL - Abnormal; Notable for the following components:      Result Value   Glucose, Bld 113 (*)    Calcium 8.8 (*)    All other components within normal limits  GC/CHLAMYDIA PROBE AMP  WET PREP, GENITAL  CBC  URINALYSIS, COMPLETE (UACMP) WITH MICROSCOPIC  RAPID HIV SCREEN (HIV 1/2 AB+AG)  TROPONIN I (HIGH SENSITIVITY)   ____________________________________________  EKG ED ECG REPORT I, [place] N Malaky Tetrault, the attending physician, personally viewed and interpreted this ECG.   Date: 11/24/2019  EKG Time: 9:19 AM  Rate: 80  Rhythm: Normal sinus rhythm  Axis: Normal  Intervals: Normal  ST&T Change: None  ________________  RADIOLOGY I, [place] N Jasyn Mey, personally viewed and evaluated these images (plain radiographs) as part of my medical decision making, as well as reviewing the written report by the radiologist.  ED MD interpretation: No acute cardiopulmonary disease  Official radiology report(s): DG Chest 2 View  Result Date: 11/24/2019 CLINICAL DATA:  Chest pain. EXAM: CHEST - 2 VIEW COMPARISON:  12/04/2018. FINDINGS: Mediastinum and hilar structures normal. Lungs are clear. No pleural effusion or pneumothorax. Heart size normal. Degenerative changes scoliosis thoracic spine. IMPRESSION: No acute cardiopulmonary disease Electronically Signed   By: Maisie Fus  Register   On: 11/24/2019 09:22     Procedures   ____________________________________________   INITIAL IMPRESSION / MDM / ASSESSMENT AND PLAN / ED COURSE  As part of my medical decision making, I reviewed the following data within the electronic MEDICAL RECORD NUMBER   39 year old female presented with above-stated history and physical exam concern for possible STD given reported history as stated above.  Patient requested to be treated before obtaining results and as such patient was given ceftriaxone 2 and 50 mg IM azithromycin 1000mg .  Regarding patient's upper abdominal discomfort ultrasound performed  to evaluate for possible cholelithiasis less likely cholecystitis.  Ultrasound revealed no acute abnormality.  Patient be referred to GI for further outpatient evaluation. ____________________________________________  FINAL CLINICAL IMPRESSION(S) / ED DIAGNOSES  Final diagnoses:  RUQ pain  STD exposure     MEDICATIONS GIVEN DURING THIS VISIT:  Medications - No data to display   ED Discharge Orders    None      *Please note:  Mary Hicks was evaluated in Emergency Department on 11/24/2019 for the symptoms described in the history of present illness. She was evaluated in the context of the global COVID-19 pandemic, which necessitated consideration that the patient might be at risk for infection with the SARS-CoV-2 virus that causes COVID-19. Institutional protocols and algorithms that pertain to the evaluation of patients at risk for COVID-19 are in a state of rapid change based on information released by regulatory bodies including the CDC and federal and state organizations. These policies and algorithms were followed during the patient's care in the ED.  Some ED evaluations and interventions may be delayed as a result of limited staffing during the pandemic.*  Note:  This document was  prepared using Systems analyst and may include unintentional dictation errors.   Gregor Hams, MD 11/24/19 [phone]

## 2019-11-24 NOTE — ED Notes (Signed)
Patient transported to Ultrasound 

## 2019-11-24 NOTE — ED Notes (Signed)
ED Provider at bedside. 

## 2019-11-26 LAB — GC/CHLAMYDIA PROBE AMP
Chlamydia trachomatis, NAA: NEGATIVE
Neisseria Gonorrhoeae by PCR: NEGATIVE

## 2019-12-01 ENCOUNTER — Ambulatory Visit: Payer: Medicaid Other | Admitting: Gastroenterology

## 2020-02-04 ENCOUNTER — Ambulatory Visit: Payer: Medicaid Other | Admitting: Gastroenterology

## 2020-02-29 ENCOUNTER — Emergency Department: Payer: Medicaid Other

## 2020-02-29 ENCOUNTER — Encounter: Payer: Self-pay | Admitting: Emergency Medicine

## 2020-02-29 ENCOUNTER — Other Ambulatory Visit: Payer: Self-pay

## 2020-02-29 DIAGNOSIS — M25562 Pain in left knee: Secondary | ICD-10-CM | POA: Insufficient documentation

## 2020-02-29 DIAGNOSIS — Z5321 Procedure and treatment not carried out due to patient leaving prior to being seen by health care provider: Secondary | ICD-10-CM | POA: Insufficient documentation

## 2020-02-29 NOTE — ED Triage Notes (Signed)
Pt presents to ED via POV, ambulatory to triage without difficulty, noted to be playing on cell phone while in triage. Pt c/o chronic L knee pain that "they wanted images done". Pt states pain 8/10 at this time.

## 2020-03-01 ENCOUNTER — Emergency Department
Admission: EM | Admit: 2020-03-01 | Discharge: 2020-03-01 | Disposition: A | Discharge status: Home / Self Care | Payer: Medicaid Other | Attending: Emergency Medicine | Admitting: Emergency Medicine

## 2020-03-01 DIAGNOSIS — S86919A Strain of unspecified muscle(s) and tendon(s) at lower leg level, unspecified leg, initial encounter: Secondary | ICD-10-CM | POA: Insufficient documentation

## 2020-03-01 NOTE — ED Notes (Signed)
Pt called from lobby with no reply. Unable to locate pt.  

## 2020-03-01 NOTE — ED Notes (Signed)
Pt called to exam room with no reply. Unable to locate pt.

## 2020-03-22 ENCOUNTER — Other Ambulatory Visit: Payer: Self-pay

## 2020-03-22 ENCOUNTER — Encounter: Payer: Self-pay | Admitting: Gastroenterology

## 2020-03-22 ENCOUNTER — Ambulatory Visit (INDEPENDENT_AMBULATORY_CARE_PROVIDER_SITE_OTHER): Payer: Medicaid Other | Admitting: Gastroenterology

## 2020-03-22 VITALS — BP 150/96 | HR 76 | Temp 98.4°F | Ht 67.0 in | Wt 255.2 lb

## 2020-03-22 DIAGNOSIS — K219 Gastro-esophageal reflux disease without esophagitis: Secondary | ICD-10-CM

## 2020-03-22 DIAGNOSIS — R110 Nausea: Secondary | ICD-10-CM | POA: Diagnosis not present

## 2020-03-22 DIAGNOSIS — R1310 Dysphagia, unspecified: Secondary | ICD-10-CM

## 2020-03-22 DIAGNOSIS — K2100 Gastro-esophageal reflux disease with esophagitis, without bleeding: Secondary | ICD-10-CM

## 2020-03-22 DIAGNOSIS — R1319 Other dysphagia: Secondary | ICD-10-CM

## 2020-03-22 MED ORDER — PANTOPRAZOLE SODIUM 40 MG PO TBEC: 40.0000 mg | DELAYED_RELEASE_TABLET | Freq: Every day | ORAL | 3 refills | Status: DC

## 2020-03-22 NOTE — H&P (View-Only) (Signed)
Gastroenterology Consultation  Referring Provider:     [facility] [place] Physician:  Letta Median, MD Primary Gastroenterologist:  Dr. Allen Norris     Reason for Consultation:     Nausea and reflux        HPI:   Mary Hicks is a 39 y.o. y/o female referred for consultation & management of nausea and reflux by Dr. Rebeca Alert, Durene Cal, MD. This patient comes in today after being seen in the emergency department back in January for nausea and reflux. The patient was started on Zofran and pantoprazole 20 mg a day. The patient reports that she is now continuing to have to take the Zofran once a week due to the nausea and sometimes twice a week. There is no report of any unexplained weight loss fevers chills nausea or vomiting. The patient also reports that she has not had any change in bowel habits. The patient denies any abdominal pain at present time.  She also reports that she has some bright red blood on the toilet paper but not mixed with the stools when she has constant patient but not at other times. In addition to the GERD and the nausea the patient also reports that she has some dysphagia to solids.  She feels like food gets hung up in her throat.  Past Medical History:  Diagnosis Date  . Asthma   . Hypertension     Past Surgical History:  Procedure Laterality Date  . ABDOMINAL HYSTERECTOMY    . CESAREAN SECTION      Prior to Admission medications   Medication Sig Start Date End Date Taking? Authorizing Provider  albuterol (PROVENTIL HFA;VENTOLIN HFA) 108 (90 Base) MCG/ACT inhaler Inhale 2 puffs into the lungs every 6 (six) hours as needed for wheezing or shortness of breath. 12/05/18  Yes Darel Hong, MD  amLODipine (NORVASC) 5 MG tablet Take by mouth. 06/01/19 05/31/20 Yes [provider]  fluticasone (FLONASE) 50 MCG/ACT nasal spray Place 1 spray into both nostrils daily. 06/18/19 06/17/20 Yes Vallarie Mare M, PA-C  ondansetron (ZOFRAN ODT)  4 MG disintegrating tablet Take 1 tablet (4 mg total) by mouth every 8 (eight) hours as needed. 11/24/19  Yes Gregor Hams, MD  cetirizine (ZYRTEC ALLERGY) 10 MG tablet Take 1 tablet (10 mg total) by mouth daily for 10 days. 06/18/19 06/28/19  Lannie Fields, PA-C  pantoprazole (PROTONIX) 40 MG tablet Take 1 tablet (40 mg total) by mouth daily. 03/22/20   Lucilla Lame, MD    History reviewed. No pertinent family history.   Social History   Tobacco Use  . Smoking status: Former Research scientist (life sciences)  . Smokeless tobacco: Never Used  Substance Use Topics  . Alcohol use: Yes  . Drug use: Not on file    Allergies as of 03/22/2020 - Review Complete 03/22/2020  Allergen Reaction Noted  . Tramadol Nausea Only 09/17/2014  . Codeine Nausea And Vomiting 07/10/2015  . Hydrocodone-acetaminophen Itching 04/01/2013    Review of Systems:    All systems reviewed and negative except where noted in HPI.   Physical Exam:  BP (!) 150/96   Pulse 76   Temp 98.4 F (36.9 C) (Oral)   Ht 5\' 7"  (1.702 m)   Wt 255 lb 3.2 oz (115.8 kg)   BMI 39.97 kg/m  No LMP recorded. Patient has had a hysterectomy. General:   Alert,  Well-developed, well-nourished, pleasant and cooperative in NAD Head:  Normocephalic and atraumatic. Eyes:  Sclera clear,  no icterus.   Conjunctiva pink. Ears:  Normal auditory acuity. Neck:  Supple; no masses or thyromegaly. Lungs:  Respirations even and unlabored.  Clear throughout to auscultation.   No wheezes, crackles, or rhonchi. No acute distress. Heart:  Regular rate and rhythm; no murmurs, clicks, rubs, or gallops. Abdomen:  Normal bowel sounds.  No bruits.  Soft, non-tender and non-distended without masses, hepatosplenomegaly or hernias noted.  No guarding or rebound tenderness.  Negative Carnett sign.   Rectal:  Deferred.  Pulses:  Normal pulses noted. Extremities:  No clubbing or edema.  No cyanosis. Neurologic:  Alert and oriented x3;  grossly normal neurologically. Skin:  Intact  without significant lesions or rashes.  No jaundice. Lymph Nodes:  No significant cervical adenopathy. Psych:  Alert and cooperative. Normal mood and affect.  Imaging Studies: DG Knee Complete 4 Views Left  Result Date: 02/29/2020 CLINICAL DATA:  Knee pain EXAM: LEFT KNEE - COMPLETE 4+ VIEW COMPARISON:  None. FINDINGS: No evidence of fracture, dislocation, or joint effusion. No evidence of arthropathy or other focal bone abnormality. Soft tissues are unremarkable. IMPRESSION: Negative. Electronically Signed   By: Jasmine Pang M.D.   On: 02/29/2020 23:22    Assessment and Plan:   Mary Hicks is a 39 y.o. y/o female who comes in today with a history of nausea and reflux.  The patient will have her pantoprazole increased to 40 mg/day.  The patient also has some dysphagia and will be set up for an upper endoscopy. I have discussed risks & benefits which include, but are not limited to, bleeding, infection, perforation & drug reaction.  The patient agrees with this plan & written consent will be obtained.       Midge Minium, MD. Clementeen Graham    Note: This dictation was prepared with Dragon dictation along with smaller phrase technology. Any transcriptional errors that result from this process are unintentional.

## 2020-03-22 NOTE — Progress Notes (Signed)
  Gastroenterology Consultation  Referring Provider:     [facility] Primary Care Physician:  Hicks, Mary Daneele, MD Primary Gastroenterologist:  Dr. Davarion Hicks     Reason for Consultation:     Nausea and reflux        HPI:   Mary Hicks is a 38 y.o. y/o female referred for consultation & management of nausea and reflux by Dr. Bender, Mary Daneele, MD. This patient comes in today after being seen in the emergency department back in January for nausea and reflux. The patient was started on Zofran and pantoprazole 20 mg a day. The patient reports that she is now continuing to have to take the Zofran once a week due to the nausea and sometimes twice a week. There is no report of any unexplained weight loss fevers chills nausea or vomiting. The patient also reports that she has not had any change in bowel habits. The patient denies any abdominal pain at present time.  She also reports that she has some bright red blood on the toilet paper but not mixed with the stools when she has constant patient but not at other times. In addition to the GERD and the nausea the patient also reports that she has some dysphagia to solids.  She feels like food gets hung up in her throat.  Past Medical History:  Diagnosis Date  . Asthma   . Hypertension     Past Surgical History:  Procedure Laterality Date  . ABDOMINAL HYSTERECTOMY    . CESAREAN SECTION      Prior to Admission medications   Medication Sig Start Date End Date Taking? Authorizing Provider  albuterol (PROVENTIL HFA;VENTOLIN HFA) 108 (90 Base) MCG/ACT inhaler Inhale 2 puffs into the lungs every 6 (six) hours as needed for wheezing or shortness of breath. 12/05/18  Yes Hicks, Neil, MD  amLODipine (NORVASC) 5 MG tablet Take by mouth. 06/01/19 05/31/20 Yes [provider]  fluticasone (FLONASE) 50 MCG/ACT nasal spray Place 1 spray into both nostrils daily. 06/18/19 06/17/20 Yes Hicks, Mary M, PA-C  ondansetron (ZOFRAN ODT)  4 MG disintegrating tablet Take 1 tablet (4 mg total) by mouth every 8 (eight) hours as needed. 11/24/19  Yes Hicks, Mary N, MD  cetirizine (ZYRTEC ALLERGY) 10 MG tablet Take 1 tablet (10 mg total) by mouth daily for 10 days. 06/18/19 06/28/19  Hicks, Mary M, PA-C  pantoprazole (PROTONIX) 40 MG tablet Take 1 tablet (40 mg total) by mouth daily. 03/22/20   Mary Mickle, MD    History reviewed. No pertinent family history.   Social History   Tobacco Use  . Smoking status: Former Smoker  . Smokeless tobacco: Never Used  Substance Use Topics  . Alcohol use: Yes  . Drug use: Not on file    Allergies as of 03/22/2020 - Review Complete 03/22/2020  Allergen Reaction Noted  . Tramadol Nausea Only 09/17/2014  . Codeine Nausea And Vomiting 07/10/2015  . Hydrocodone-acetaminophen Itching 04/01/2013    Review of Systems:    All systems reviewed and negative except where noted in HPI.   Physical Exam:  BP (!) 150/96   Pulse 76   Temp 98.4 F (36.9 C) (Oral)   Ht 5' 7" (1.702 m)   Wt 255 lb 3.2 oz (115.8 kg)   BMI 39.97 kg/m  No LMP recorded. Patient has had a hysterectomy. General:   Alert,  Well-developed, well-nourished, pleasant and cooperative in NAD Head:  Normocephalic and atraumatic. Eyes:  Sclera clear,   no icterus.   Conjunctiva pink. Ears:  Normal auditory acuity. Neck:  Supple; no masses or thyromegaly. Lungs:  Respirations even and unlabored.  Clear throughout to auscultation.   No wheezes, crackles, or rhonchi. No acute distress. Heart:  Regular rate and rhythm; no murmurs, clicks, rubs, or gallops. Abdomen:  Normal bowel sounds.  No bruits.  Soft, non-tender and non-distended without masses, hepatosplenomegaly or hernias noted.  No guarding or rebound tenderness.  Negative Carnett sign.   Rectal:  Deferred.  Pulses:  Normal pulses noted. Extremities:  No clubbing or edema.  No cyanosis. Neurologic:  Alert and oriented x3;  grossly normal neurologically. Skin:  Intact  without significant lesions or rashes.  No jaundice. Lymph Nodes:  No significant cervical adenopathy. Psych:  Alert and cooperative. Normal mood and affect.  Imaging Studies: DG Knee Complete 4 Views Left  Result Date: 02/29/2020 CLINICAL DATA:  Knee pain EXAM: LEFT KNEE - COMPLETE 4+ VIEW COMPARISON:  None. FINDINGS: No evidence of fracture, dislocation, or joint effusion. No evidence of arthropathy or other focal bone abnormality. Soft tissues are unremarkable. IMPRESSION: Negative. Electronically Signed   By: Jasmine Pang M.D.   On: 02/29/2020 23:22    Assessment and Plan:   Mary Hicks is a 39 y.o. y/o female who comes in today with a history of nausea and reflux.  The patient will have her pantoprazole increased to 40 mg/day.  The patient also has some dysphagia and will be set up for an upper endoscopy. I have discussed risks & benefits which include, but are not limited to, bleeding, infection, perforation & drug reaction.  The patient agrees with this plan & written consent will be obtained.       Midge Minium, MD. Clementeen Graham    Note: This dictation was prepared with Dragon dictation along with smaller phrase technology. Any transcriptional errors that result from this process are unintentional.

## 2020-03-31 ENCOUNTER — Ambulatory Visit (INDEPENDENT_AMBULATORY_CARE_PROVIDER_SITE_OTHER): Payer: Medicaid Other | Admitting: Urology

## 2020-03-31 ENCOUNTER — Encounter: Payer: Self-pay | Admitting: Urology

## 2020-03-31 ENCOUNTER — Other Ambulatory Visit: Payer: Self-pay

## 2020-03-31 VITALS — BP 131/82 | HR 69 | Ht 67.0 in | Wt 255.0 lb

## 2020-03-31 DIAGNOSIS — R109 Unspecified abdominal pain: Secondary | ICD-10-CM

## 2020-03-31 DIAGNOSIS — R399 Unspecified symptoms and signs involving the genitourinary system: Secondary | ICD-10-CM

## 2020-03-31 DIAGNOSIS — N3281 Overactive bladder: Secondary | ICD-10-CM

## 2020-03-31 LAB — URINALYSIS, COMPLETE
Bilirubin, UA: NEGATIVE
Glucose, UA: NEGATIVE
Ketones, UA: NEGATIVE
Nitrite, UA: NEGATIVE
Protein,UA: NEGATIVE
RBC, UA: NEGATIVE
Specific Gravity, UA: 1.02 (ref 1.005–1.030)
Urobilinogen, Ur: 1 mg/dL (ref 0.2–1.0)
pH, UA: 7 (ref 5.0–7.5)

## 2020-03-31 LAB — MICROSCOPIC EXAMINATION: RBC, Urine: NONE SEEN /hpf (ref 0–2)

## 2020-03-31 LAB — BLADDER SCAN AMB NON-IMAGING

## 2020-03-31 MED ORDER — OXYBUTYNIN CHLORIDE ER 10 MG PO TB24: 10.0000 mg | ORAL_TABLET | Freq: Every day | ORAL | 11 refills | Status: DC

## 2020-03-31 NOTE — Progress Notes (Signed)
03/31/20 4:27 PM   Audley Hose 08-28-1981 [id number]  CC: History of left ureteral reimplant, overactive bladder, left flank pain  HPI: I saw [name] in urology clinic today for evaluation of the above issues.  She is a 39 year old female with morbid obesity and BMI of 40 who reports worsening urinary symptoms of urination every 10 to 30 minutes during the day and urgency, as well as nocturia 3 times overnight.  She denies any urge or stress incontinence.  She denies any urinary tract infections or gross hematuria.  She is currently being treated for BV.  Multiple urinalyses over the last 4 months have been benign with no evidence of infection or hematuria.  She has an interesting urologic history and that she underwent an emergent hysterectomy and 2009 at [facility] that was complicated by a left ureteral injury requiring left ureteral reimplant with urology.  It sounds like this developed a stricture and underwent ureteral balloon dilation, however she stopped following up with urology around 2014.  She reports over the last year she has had intermittent left-sided flank pain.  She is unable to associate this it with any particular aggravating factors or alleviating factors.  She does not think it is worse when she drinks more fluid.  It is not worse during voiding.  Her primary complaint today is the frequency of urination and nocturia 3 times per night.  She is not currently on any bladder medications.  She reportedly was drinking lots of soda previously but cut this out in the last month.  She drinks mostly water and juice during the day.  Notably, she underwent urodynamics at [facility] in April 2014 that showed no vesicoureteral reflux, and detrusor overactivity consistent with overactive bladder.  There was no stress incontinence documented.  She has a 20-pack-year smoking history, and quit 2 months ago.  Urinalysis today benign 0-5 WBCs, 0 RBCs, no bacteria, trace leukocytes, PVR 0  mL.   PMH: Past Medical History:  Diagnosis Date  . Asthma   . Hypertension     Surgical History: Past Surgical History:  Procedure Laterality Date  . ABDOMINAL HYSTERECTOMY    . CESAREAN SECTION      Family History: No family history on file.  Social History:  reports that she has quit smoking. She has never used smokeless tobacco. She reports current alcohol use. She reports that she does not use drugs.  Physical Exam: BP 131/82 (BP Location: Left Arm, Patient Position: Sitting, Cuff Size: Normal)   Pulse 69   Ht 5\' 7"  (1.702 m)   Wt 255 lb (115.7 kg)   BMI 39.94 kg/m    Constitutional:  Alert and oriented, No acute distress. Cardiovascular: No clubbing, cyanosis, or edema. Respiratory: Normal respiratory effort, no increased work of breathing. GI: Abdomen is soft, nontender, nondistended, no abdominal masses GU: No CVA tenderness  Laboratory Data: Reviewed  Assessment & Plan:   In summary, she is a 39 year old female with morbid obesity and history of a left ureteral injury during hysterectomy in 2009 requiring a ureteral reimplant at [facility] who presents with intermittent left-sided flank pain of unclear etiology as well as overactive bladder symptoms of urinary frequency and nocturia.  We discussed that overactive bladder (OAB) is not a disease, but is a symptom complex that is generally not life-threatening.  Symptoms typically include urinary urgency, frequency, and urge incontinence.  There are numerous treatment options, however there are risks and benefits with both medical and surgical management.  First-line treatment is  behavioral therapies including bladder training, pelvic floor muscle training, and fluid management.  Second line treatments include oral antimuscarinics(Ditropan er, Trospium) and beta-3 agonist (Mybetriq). There is typically a period of medication trial (4-8 weeks) to find the optimal therapy and dosing. If symptoms are bothersome despite the  above management, third line options include intra-detrusor botox, peripheral tibial nerve stimulation (PTNS), and interstim (SNS). These are more invasive treatments with higher side effect profile, but may improve quality of life for patients with severe OAB symptoms.   -Behavioral strategies discussed regarding OAB -Trial of oxybutynin 10 mg XL, side effects discussed -Renal ultrasound to evaluate for left-sided hydronephrosis in setting of prior left ureteral reimplant and flank pain  Nickolas Madrid, MD 03/31/2020  [place] [address] [place], [place] [address] [phone]

## 2020-03-31 NOTE — Patient Instructions (Signed)

## 2020-04-08 ENCOUNTER — Other Ambulatory Visit: Admission: RE | Admit: 2020-04-08 | Payer: Medicaid Other | Source: Ambulatory Visit

## 2020-04-11 ENCOUNTER — Other Ambulatory Visit
Admission: RE | Admit: 2020-04-11 | Discharge: 2020-04-11 | Disposition: A | Discharge status: Home / Self Care | Payer: Medicaid Other | Source: Ambulatory Visit | Attending: Gastroenterology | Admitting: Gastroenterology

## 2020-04-11 ENCOUNTER — Other Ambulatory Visit: Payer: Self-pay

## 2020-04-11 DIAGNOSIS — Z20822 Contact with and (suspected) exposure to covid-19: Secondary | ICD-10-CM | POA: Insufficient documentation

## 2020-04-11 DIAGNOSIS — Z01812 Encounter for preprocedural laboratory examination: Secondary | ICD-10-CM | POA: Insufficient documentation

## 2020-04-11 LAB — SARS CORONAVIRUS 2 (TAT 6-24 HRS): SARS Coronavirus 2: NEGATIVE

## 2020-04-12 ENCOUNTER — Ambulatory Visit: Payer: Medicaid Other | Admitting: Anesthesiology

## 2020-04-12 ENCOUNTER — Encounter
Admission: RE | Disposition: A | Discharge status: Home / Self Care | Payer: Self-pay | Source: Home / Self Care | Attending: Gastroenterology

## 2020-04-12 ENCOUNTER — Other Ambulatory Visit: Payer: Self-pay

## 2020-04-12 ENCOUNTER — Encounter: Payer: Self-pay | Admitting: Gastroenterology

## 2020-04-12 ENCOUNTER — Ambulatory Visit
Admission: RE | Admit: 2020-04-12 | Discharge: 2020-04-12 | Disposition: A | Discharge status: Home / Self Care | Payer: Medicaid Other | Attending: Gastroenterology | Admitting: Gastroenterology

## 2020-04-12 DIAGNOSIS — Z90710 Acquired absence of both cervix and uterus: Secondary | ICD-10-CM | POA: Diagnosis not present

## 2020-04-12 DIAGNOSIS — R1310 Dysphagia, unspecified: Secondary | ICD-10-CM | POA: Insufficient documentation

## 2020-04-12 DIAGNOSIS — K219 Gastro-esophageal reflux disease without esophagitis: Secondary | ICD-10-CM | POA: Insufficient documentation

## 2020-04-12 DIAGNOSIS — Z79899 Other long term (current) drug therapy: Secondary | ICD-10-CM | POA: Diagnosis not present

## 2020-04-12 DIAGNOSIS — I10 Essential (primary) hypertension: Secondary | ICD-10-CM | POA: Insufficient documentation

## 2020-04-12 DIAGNOSIS — K2100 Gastro-esophageal reflux disease with esophagitis, without bleeding: Secondary | ICD-10-CM

## 2020-04-12 DIAGNOSIS — J45909 Unspecified asthma, uncomplicated: Secondary | ICD-10-CM | POA: Diagnosis not present

## 2020-04-12 DIAGNOSIS — F419 Anxiety disorder, unspecified: Secondary | ICD-10-CM | POA: Diagnosis not present

## 2020-04-12 DIAGNOSIS — L83 Acanthosis nigricans: Secondary | ICD-10-CM | POA: Insufficient documentation

## 2020-04-12 DIAGNOSIS — Z888 Allergy status to other drugs, medicaments and biological substances status: Secondary | ICD-10-CM | POA: Insufficient documentation

## 2020-04-12 DIAGNOSIS — Z6839 Body mass index (BMI) 39.0-39.9, adult: Secondary | ICD-10-CM | POA: Insufficient documentation

## 2020-04-12 DIAGNOSIS — R1319 Other dysphagia: Secondary | ICD-10-CM

## 2020-04-12 DIAGNOSIS — K228 Other specified diseases of esophagus: Secondary | ICD-10-CM | POA: Diagnosis not present

## 2020-04-12 DIAGNOSIS — F329 Major depressive disorder, single episode, unspecified: Secondary | ICD-10-CM | POA: Insufficient documentation

## 2020-04-12 DIAGNOSIS — Z87891 Personal history of nicotine dependence: Secondary | ICD-10-CM | POA: Insufficient documentation

## 2020-04-12 DIAGNOSIS — K2289 Other specified disease of esophagus: Secondary | ICD-10-CM

## 2020-04-12 DIAGNOSIS — Z885 Allergy status to narcotic agent status: Secondary | ICD-10-CM | POA: Diagnosis not present

## 2020-04-12 HISTORY — PX: ESOPHAGOGASTRODUODENOSCOPY (EGD) WITH PROPOFOL: SHX5813

## 2020-04-12 HISTORY — DX: Urgency of urination: R39.15

## 2020-04-12 SURGERY — ESOPHAGOGASTRODUODENOSCOPY (EGD) WITH PROPOFOL
Anesthesia: General

## 2020-04-12 MED ORDER — SODIUM CHLORIDE 0.9 % IV SOLN: INTRAVENOUS | Status: DC

## 2020-04-12 MED ORDER — LIDOCAINE HCL (CARDIAC) PF 100 MG/5ML IV SOSY
PREFILLED_SYRINGE | INTRAVENOUS | Status: DC | PRN
  Administered 2020-04-12: 60 mg via INTRAVENOUS

## 2020-04-12 MED ORDER — PROPOFOL 10 MG/ML IV BOLUS
INTRAVENOUS | Status: DC | PRN
  Administered 2020-04-12: 30 mg via INTRAVENOUS
  Administered 2020-04-12 (×2): 50 mg via INTRAVENOUS
  Administered 2020-04-12: 20 mg via INTRAVENOUS

## 2020-04-12 MED ORDER — PROPOFOL 500 MG/50ML IV EMUL
INTRAVENOUS | Status: DC | PRN
  Administered 2020-04-12: 200 ug/kg/min via INTRAVENOUS

## 2020-04-12 MED ORDER — DEXMEDETOMIDINE HCL IN NACL 200 MCG/50ML IV SOLN
INTRAVENOUS | Status: DC | PRN
Start: 2020-04-12 — End: 2020-04-12
  Administered 2020-04-12: 8 ug via INTRAVENOUS
  Administered 2020-04-12: 4 ug via INTRAVENOUS
  Administered 2020-04-12: 8 ug via INTRAVENOUS

## 2020-04-12 NOTE — Anesthesia Preprocedure Evaluation (Signed)
Anesthesia Evaluation  Patient identified by MRN, date of birth, ID band Patient awake    Reviewed: Allergy & Precautions, H&P , NPO status , Patient's Chart, lab work & pertinent test results, reviewed documented beta blocker date and time   History of Anesthesia Complications Negative for: history of anesthetic complications  Airway Mallampati: II  TM Distance: >3 FB Neck ROM: full    Dental  (+) Dental Advidsory Given, Teeth Intact, Missing   Pulmonary neg shortness of breath, asthma , neg sleep apnea, neg COPD, neg recent URI, former smoker,    Pulmonary exam normal breath sounds clear to auscultation       Cardiovascular Exercise Tolerance: Good hypertension, (-) angina(-) Past MI and (-) Cardiac Stents Normal cardiovascular exam(-) dysrhythmias (-) Valvular Problems/Murmurs Rhythm:regular Rate:Normal     Neuro/Psych PSYCHIATRIC DISORDERS Anxiety Depression negative neurological ROS     GI/Hepatic negative GI ROS, Neg liver ROS,   Endo/Other  neg diabetesMorbid obesity  Renal/GU negative Renal ROS  negative genitourinary   Musculoskeletal   Abdominal   Peds  Hematology negative hematology ROS (+)   Anesthesia Other Findings Past Medical History: No date: Asthma No date: Hypertension No date: Urinary urgency   Reproductive/Obstetrics negative OB ROS                             Anesthesia Physical Anesthesia Plan  ASA: III  Anesthesia Plan: General   Post-op Pain Management:    Induction: Intravenous  PONV Risk Score and Plan: Propofol infusion and TIVA  Airway Management Planned: Natural Airway and Nasal Cannula  Additional Equipment:   Intra-op Plan:   Post-operative Plan:   Informed Consent: I have reviewed the patients History and Physical, chart, labs and discussed the procedure including the risks, benefits and alternatives for the proposed anesthesia with the  patient or authorized representative who has indicated his/her understanding and acceptance.     Dental Advisory Given  Plan Discussed with: Anesthesiologist, CRNA and Surgeon  Anesthesia Plan Comments:         Anesthesia Quick Evaluation

## 2020-04-12 NOTE — Op Note (Signed)
[facility] Gastroenterology Patient Name: Mary Hicks Procedure Date: 04/12/2020 10:29 AM MRN: [id number] Account #: [id number] Date of Birth: Mar 18, 1981 Admit Type: Outpatient Age: 39 Room: [facility] ENDO ROOM 4 Gender: Female Note Status: Finalized Procedure:             Upper GI endoscopy Indications:           Dysphagia Providers:             Midge Minium MD, MD Medicines:             Propofol per Anesthesia Complications:         No immediate complications. Procedure:             Pre-Anesthesia Assessment:                        - Prior to the procedure, a History and Physical was                         performed, and patient medications and allergies were                         reviewed. The patient's tolerance of previous                         anesthesia was also reviewed. The risks and benefits                         of the procedure and the sedation options and risks                         were discussed with the patient. All questions were                         answered, and informed consent was obtained. Prior                         Anticoagulants: The patient has taken no previous                         anticoagulant or antiplatelet agents. ASA Grade                         Assessment: II - A patient with mild systemic disease.                         After reviewing the risks and benefits, the patient                         was deemed in satisfactory condition to undergo the                         procedure.                        After obtaining informed consent, the endoscope was                         passed under direct vision. Throughout the procedure,  the patient's blood pressure, pulse, and oxygen                         saturations were monitored continuously. The Endoscope                         was introduced through the mouth, and advanced to the                         second part of duodenum. The upper  GI endoscopy was                         accomplished without difficulty. The patient tolerated                         the procedure well. Findings:      The Z-line was irregular and was found at the gastroesophageal junction.      Two biopsies were obtained with cold forceps for histology in the middle       third of the esophagus.      The stomach was normal.      The examined duodenum was normal. Impression:            - Z-line irregular, at the gastroesophageal junction.                        - Normal stomach.                        - Normal examined duodenum.                        - Biopsy performed in the middle third of the                         esophagus. Recommendation:        - Discharge patient to home.                        - Resume previous diet.                        - Continue present medications.                        - Await pathology results. Procedure Code(s):     --- Professional ---                        [phone], Esophagogastroduodenoscopy, flexible,                         transoral; with biopsy, single or multiple Diagnosis Code(s):     --- Professional ---                        R13.10, Dysphagia, unspecified                        K22.8, Other specified diseases of esophagus CPT copyright 2019 American Medical Association. All rights reserved. The codes documented in this report are preliminary and upon coder review may  be  revised to meet current compliance requirements. Lucilla Lame MD, MD 04/12/2020 10:55:38 AM This report has been signed electronically. Number of Addenda: 0 Note Initiated On: 04/12/2020 10:29 AM Estimated Blood Loss:  Estimated blood loss: none.      [facility]

## 2020-04-12 NOTE — Anesthesia Postprocedure Evaluation (Deleted)
Anesthesia Post Note  Patient: Mary Hicks  Procedure(s) Performed: ESOPHAGOGASTRODUODENOSCOPY (EGD) WITH PROPOFOL (N/A )  Patient location during evaluation: Phase II Anesthesia Type: General and MAC Level of consciousness: awake and alert and awake Pain management: pain level controlled Vital Signs Assessment: post-procedure vital signs reviewed and stable Respiratory status: spontaneous breathing Cardiovascular status: blood pressure returned to baseline Postop Assessment: no headache Anesthetic complications: no     Last Vitals:  Vitals:   04/12/20 0944 04/12/20 1058  BP: (!) 156/97 135/80  Pulse: 60   Resp: 18   Temp: (!) 35.9 C 36.6 C  SpO2: 100%     Last Pain:  Vitals:   04/12/20 1058  TempSrc: Temporal                 Dierdre Forth Disser

## 2020-04-12 NOTE — Interval H&P Note (Signed)
History and Physical Interval Note:  04/12/2020 1:34 PM  Mary Hicks  has presented today for surgery, with the diagnosis of GERD K21.9.  The various methods of treatment have been discussed with the patient and family. After consideration of risks, benefits and other options for treatment, the patient has consented to  Procedure(s): ESOPHAGOGASTRODUODENOSCOPY (EGD) WITH PROPOFOL (N/A) as a surgical intervention.  The patient's history has been reviewed, patient examined, no change in status, stable for surgery.  I have reviewed the patient's chart and labs.  Questions were answered to the patient's satisfaction.     Neiman Roots [employer]

## 2020-04-12 NOTE — Anesthesia Postprocedure Evaluation (Signed)
Anesthesia Post Note  Patient: Mary Hicks  Procedure(s) Performed: ESOPHAGOGASTRODUODENOSCOPY (EGD) WITH PROPOFOL (N/A )  Patient location during evaluation: Endoscopy Anesthesia Type: General Level of consciousness: awake and alert Pain management: pain level controlled Vital Signs Assessment: post-procedure vital signs reviewed and stable Respiratory status: spontaneous breathing, nonlabored ventilation, respiratory function stable and patient connected to nasal cannula oxygen Cardiovascular status: blood pressure returned to baseline and stable Postop Assessment: no apparent nausea or vomiting Anesthetic complications: no     Last Vitals:  Vitals:   04/12/20 0944 04/12/20 1058  BP: (!) 156/97 135/80  Pulse: 60   Resp: 18   Temp: (!) 35.9 C 36.6 C  SpO2: 100%     Last Pain:  Vitals:   04/12/20 1128  TempSrc:   PainSc: 0-No pain                 Lenard Simmer

## 2020-04-12 NOTE — Transfer of Care (Signed)
Immediate Anesthesia Transfer of Care Note  Patient: Mary Hicks  Procedure(s) Performed: ESOPHAGOGASTRODUODENOSCOPY (EGD) WITH PROPOFOL (N/A )  Patient Location: PACU  Anesthesia Type:MAC and General  Level of Consciousness: awake, alert  and oriented  Airway & Oxygen Therapy: Patient Spontanous Breathing  Post-op Assessment: Report given to RN and Post -op Vital signs reviewed and stable  Post vital signs: Reviewed  Last Vitals:  Vitals Value Taken Time  BP 135/80 04/12/20 1059  Temp 36.6 C 04/12/20 1058  Pulse 59 04/12/20 1100  Resp 29 04/12/20 1100  SpO2 99 % 04/12/20 1100  Vitals shown include unvalidated device data.  Last Pain:  Vitals:   04/12/20 1058  TempSrc: Temporal         Complications: No apparent anesthesia complications

## 2020-04-13 ENCOUNTER — Encounter: Payer: Self-pay | Admitting: Gastroenterology

## 2020-04-13 ENCOUNTER — Encounter: Payer: Self-pay | Admitting: *Deleted

## 2020-04-13 LAB — SURGICAL PATHOLOGY

## 2020-04-14 ENCOUNTER — Other Ambulatory Visit: Payer: Self-pay

## 2020-04-14 ENCOUNTER — Ambulatory Visit
Admission: RE | Admit: 2020-04-14 | Discharge: 2020-04-14 | Disposition: A | Discharge status: Home / Self Care | Payer: Medicaid Other | Source: Ambulatory Visit | Attending: Urology | Admitting: Urology

## 2020-04-14 DIAGNOSIS — R399 Unspecified symptoms and signs involving the genitourinary system: Secondary | ICD-10-CM | POA: Diagnosis present

## 2020-04-19 ENCOUNTER — Telehealth: Payer: Self-pay

## 2020-04-19 NOTE — Telephone Encounter (Signed)
-----   Message from Sondra Come, MD sent at 04/15/2020  2:42 PM EDT ----- Good news, normal kidney US, no blockage from prior ureteral re-implant surgery. Keep follow up as scheduled re OAB. Thanks  Legrand Rams, MD 04/15/2020

## 2020-04-19 NOTE — Telephone Encounter (Signed)
Called pt informed her of the information below. Pt gave verbal understanding.  

## 2020-05-11 ENCOUNTER — Other Ambulatory Visit: Payer: Self-pay

## 2020-06-01 ENCOUNTER — Other Ambulatory Visit: Payer: Self-pay

## 2020-06-01 ENCOUNTER — Ambulatory Visit (INDEPENDENT_AMBULATORY_CARE_PROVIDER_SITE_OTHER): Payer: Medicaid Other | Admitting: Urology

## 2020-06-01 ENCOUNTER — Encounter: Payer: Self-pay | Admitting: Urology

## 2020-06-01 VITALS — BP 145/97 | HR 80 | Ht 67.0 in | Wt 245.0 lb

## 2020-06-01 DIAGNOSIS — N3281 Overactive bladder: Secondary | ICD-10-CM | POA: Diagnosis not present

## 2020-06-01 NOTE — Progress Notes (Signed)
   06/01/2020 11:07 AM   Mary Hicks October 03, 1981 [id number]  Reason for visit: Follow up OAB, history of left ureteral injury and re-implant  HPI: I saw [name] back in urology clinic today for follow-up of the above issues.  Briefly, she is a 39 year old female with BMI of 31 who has had worsening urinary symptoms of urinary frequency every 10 to 30 minutes during the day, with urgency, and nocturia 3 times overnight.  She does not have any incontinence.  She has cut out all sodas, and only drinks water and juice during the day.  Urodynamics at [facility] in April 2014 showed no vesicoureteral reflux, and there was detrusor overactivity consistent with overactive bladder.  She has not had any urinary tract infections or gross hematuria, and urinalysis has been completely benign.  Her history is also notable for a left ureteral injury during an emergent hysterectomy in 2009 at [facility] that required a reimplant, as well as a follow-up ureteral balloon dilation.  She also has had some intermittent left-sided flank pain.  At our last visit, I recommended a renal ultrasound to rule out any hydronephrosis, and this was performed on 04/15/2020 and showed no abnormalities or hydronephrosis.  I also recommended a trial of oxybutynin 10 mg extended release for her OAB symptoms.  She took this for 3 weeks but did not notice any improvement in her urinary symptoms, and felt like it was changing her thinking and discontinued the medication.  We again discussed that overactive bladder (OAB) is not a disease, but is a symptom complex that is generally not life-threatening.  Symptoms typically include urinary urgency, frequency, and urge incontinence.  There are numerous treatment options, however there are risks and benefits with both medical and surgical management.  First-line treatment is behavioral therapies including bladder training, pelvic floor muscle training, and fluid management.  Second line treatments  include oral antimuscarinics(Ditropan er, Trospium) and beta-3 agonist (Mybetriq). There is typically a period of medication trial (4-8 weeks) to find the optimal therapy and dosing. If symptoms are bothersome despite the above management, third line options include intra-detrusor botox, peripheral tibial nerve stimulation (PTNS), and interstim (SNS). These are more invasive treatments with higher side effect profile, but may improve quality of life for patients with severe OAB symptoms.   -Trial of toviaz 8mg  daily,1 month samples given, risks and benefits discussed -Virtual visit 4 weeks for symptom check, consider Myrbetriq at that time if persistent symptoms first PTNS  , MD  [facility] [address], [address] [place], [place] [phone]

## 2020-06-01 NOTE — Patient Instructions (Signed)

## 2020-06-29 ENCOUNTER — Telehealth: Payer: Self-pay | Admitting: Urology

## 2020-07-13 ENCOUNTER — Other Ambulatory Visit: Payer: Self-pay

## 2020-07-13 ENCOUNTER — Telehealth (INDEPENDENT_AMBULATORY_CARE_PROVIDER_SITE_OTHER): Payer: Medicaid Other | Admitting: Urology

## 2020-07-13 DIAGNOSIS — N3281 Overactive bladder: Secondary | ICD-10-CM | POA: Diagnosis not present

## 2020-07-13 DIAGNOSIS — Z9889 Other specified postprocedural states: Secondary | ICD-10-CM | POA: Diagnosis not present

## 2020-07-13 NOTE — Progress Notes (Signed)
This service is provided via telemedicine   No vital signs collected/recorded due to the encounter was a telemedicine visit.     Patient consents to a telephone visit: Patient gave verbal consent.    Names of all persons participating in the telemedicine service and their role in the encounter:  Mary Hicks, patient.

## 2020-07-13 NOTE — Progress Notes (Signed)
Virtual Visit via Telephone Note  I connected with Mary Hicks on 07/13/20 at  8:30 AM EDT by telephone and verified that I am speaking with the correct person using two identifiers.   Patient location: Home Provider location: [facility]ic Office   I discussed the limitations, risks, security and privacy concerns of performing an evaluation and management service by telephone and the availability of in person appointments. We discussed the impact of the COVID-19 pandemic on the healthcare system, and the importance of social distancing and reducing patient and provider exposure. I also discussed with the patient that there may be a patient responsible charge related to this service. The patient expressed understanding and agreed to proceed.  Reason for visit: OAB/urinary symptoms  History of Present Illness: I had phone follow-up with [name] today regarding her OAB and urinary symptoms.  Her history is notable for ureteral reimplant at [facility] in 2009, including a follow-up ureteral balloon dilation for stricture.  When I last saw her in July 2021 she had no hydronephrosis on an ultrasound.  Her primary complaints at that time were overactive bladder with urgency and frequency during the day, as well as nocturia.  Urinalysis at her visit in May was benign with 0-5 WBCs, 0 RBCs, no bacteria, trace leukocytes with a PVR of 0 mL.  We trialed oxybutynin XL, as well as Toviaz without any improvement.  She reports she was recently diagnosed with a UTI and treated with antibiotics at an urgent care, and her urinary symptoms have resolved since that time.  She denies any significant urinary complaints today.   We discussed return precautions including gross hematuria or worsening urinary symptoms.  Could consider Myrbetriq or PTNS in the future if recurrent OAB symptoms.  Follow Up: RTC 1 year symptom check   I discussed the assessment and treatment plan with the patient. The patient was  provided an opportunity to ask questions and all were answered. The patient agreed with the plan and demonstrated an understanding of the instructions.   The patient was advised to call back or seek an in-person evaluation if the symptoms worsen or if the condition fails to improve as anticipated.  I provided 12 minutes of non-face-to-face time during this encounter.   Sondra Come, MD

## 2021-01-02 ENCOUNTER — Ambulatory Visit: Payer: Medicaid Other | Admitting: Urology

## 2021-01-16 ENCOUNTER — Ambulatory Visit: Payer: Medicaid Other | Admitting: Urology

## 2021-03-11 ENCOUNTER — Emergency Department
Admission: EM | Admit: 2021-03-11 | Discharge: 2021-03-11 | Disposition: A | Discharge status: Home / Self Care | Payer: Medicaid Other | Attending: Emergency Medicine | Admitting: Emergency Medicine

## 2021-03-11 ENCOUNTER — Emergency Department: Payer: Medicaid Other

## 2021-03-11 ENCOUNTER — Other Ambulatory Visit: Payer: Self-pay

## 2021-03-11 DIAGNOSIS — S39012A Strain of muscle, fascia and tendon of lower back, initial encounter: Secondary | ICD-10-CM | POA: Diagnosis not present

## 2021-03-11 DIAGNOSIS — R030 Elevated blood-pressure reading, without diagnosis of hypertension: Secondary | ICD-10-CM

## 2021-03-11 DIAGNOSIS — Z79899 Other long term (current) drug therapy: Secondary | ICD-10-CM | POA: Insufficient documentation

## 2021-03-11 DIAGNOSIS — Y92410 Unspecified street and highway as the place of occurrence of the external cause: Secondary | ICD-10-CM | POA: Insufficient documentation

## 2021-03-11 DIAGNOSIS — Z87891 Personal history of nicotine dependence: Secondary | ICD-10-CM | POA: Diagnosis not present

## 2021-03-11 DIAGNOSIS — I10 Essential (primary) hypertension: Secondary | ICD-10-CM | POA: Diagnosis not present

## 2021-03-11 DIAGNOSIS — J45909 Unspecified asthma, uncomplicated: Secondary | ICD-10-CM | POA: Insufficient documentation

## 2021-03-11 DIAGNOSIS — S161XXA Strain of muscle, fascia and tendon at neck level, initial encounter: Secondary | ICD-10-CM | POA: Insufficient documentation

## 2021-03-11 DIAGNOSIS — S3992XA Unspecified injury of lower back, initial encounter: Secondary | ICD-10-CM | POA: Diagnosis present

## 2021-03-11 MED ORDER — NAPROXEN 500 MG PO TABS: 500.0000 mg | ORAL_TABLET | Freq: Two times a day (BID) | ORAL | 0 refills | Status: DC
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
## 2021-03-11 NOTE — ED Notes (Signed)
See triage note. C/o back and neck pain.

## 2021-03-11 NOTE — Discharge Instructions (Signed)
Follow-up with your primary care provider to have your blood pressure rechecked as it was elevated in the emergency department at 147/102.  Continue taking the ibuprofen and begin taking the naproxen 500 mg twice daily with food.  You may take Tylenol with this medication if additional pain medication is needed.  Also use moist heat or ice to your muscles as needed for discomfort.

## 2021-06-19 ENCOUNTER — Other Ambulatory Visit: Payer: Self-pay | Admitting: Physician Assistant

## 2021-06-19 DIAGNOSIS — Z1231 Encounter for screening mammogram for malignant neoplasm of breast: Secondary | ICD-10-CM

## 2021-07-13 ENCOUNTER — Ambulatory Visit: Payer: Self-pay | Admitting: Urology

## 2021-07-13 IMAGING — US US ABDOMEN LIMITED
1 series · 14 of 25 positions shown · non-contrast
Comparison: Chest radiographs today.

CLINICAL DATA: 38-year-old female with 3 days of right upper
quadrant abdominal pain.

EXAM:
ULTRASOUND ABDOMEN LIMITED RIGHT UPPER QUADRANT

[Series 1: us abdomen limited · 14 of 35 slices shown]
[im 1/35]
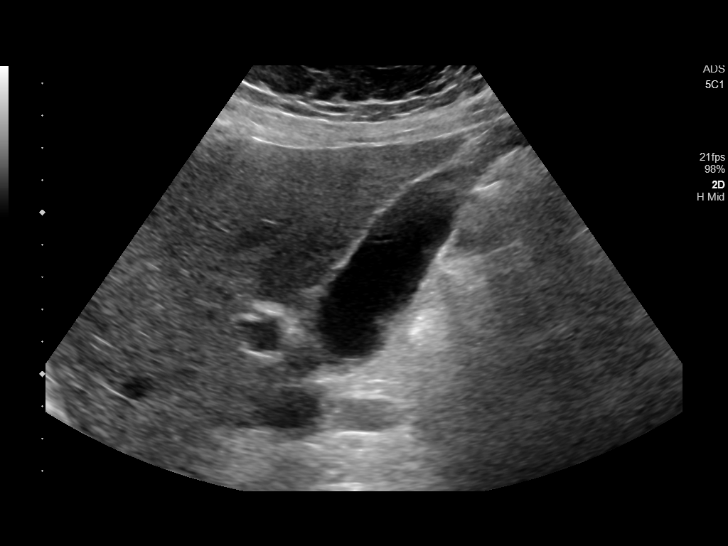
[im 3/35]
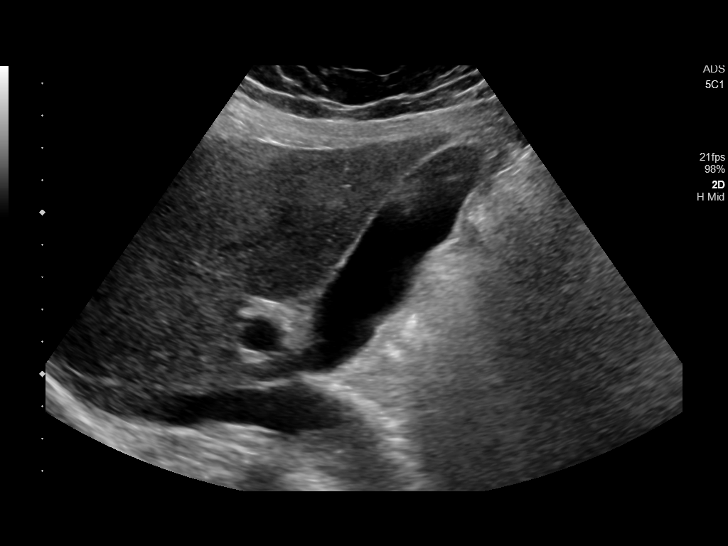
[im 6/35]
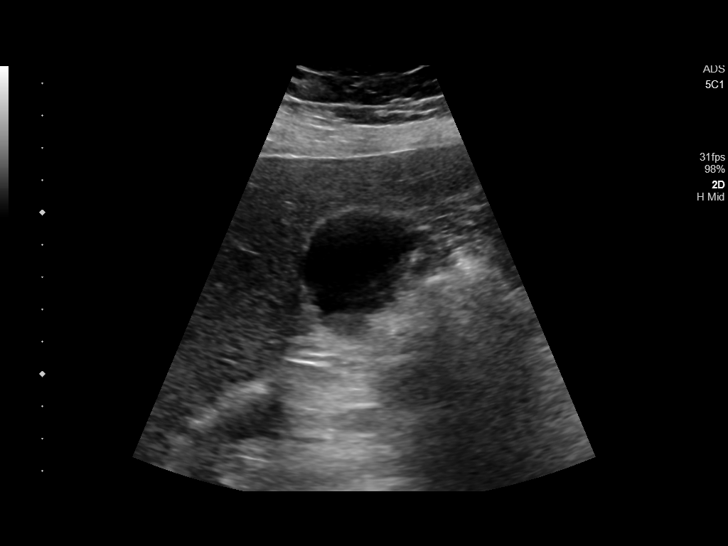
[im 9/35]
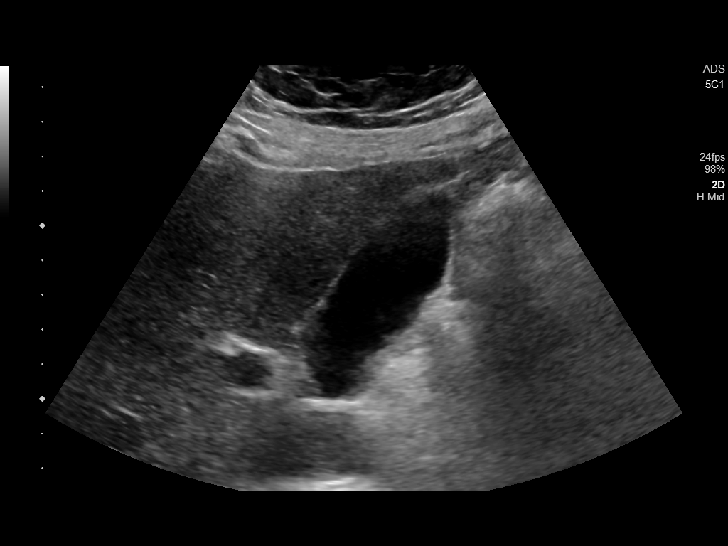
[im 12/35]
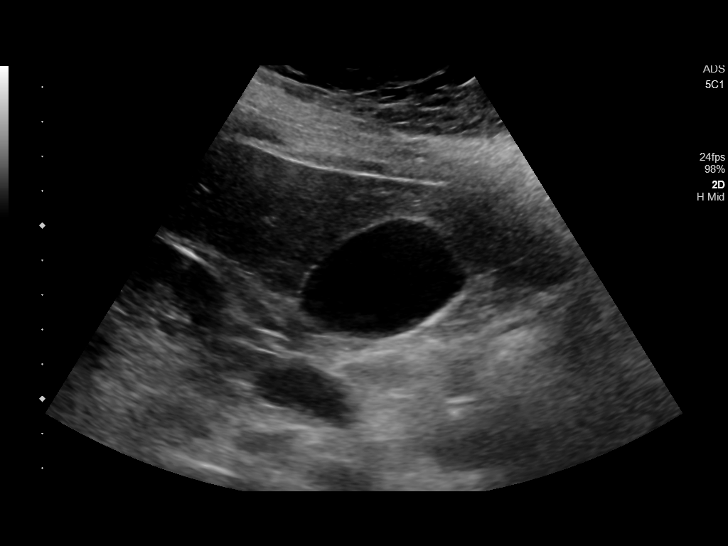
[im 13/35]
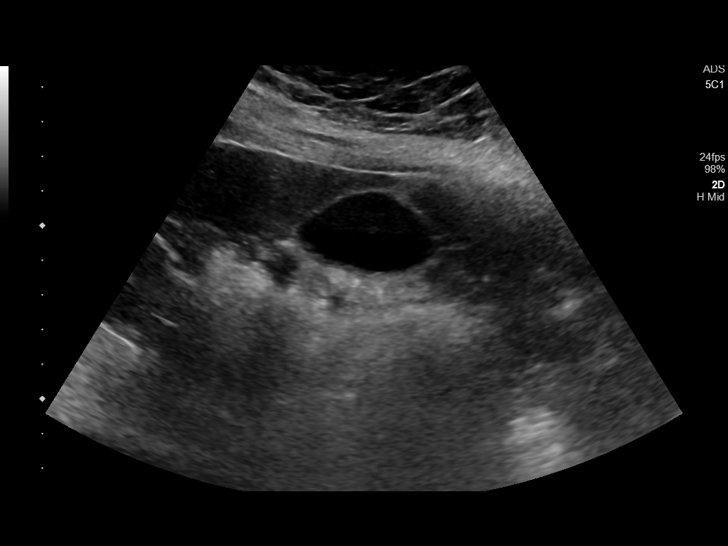
[im 16/35]
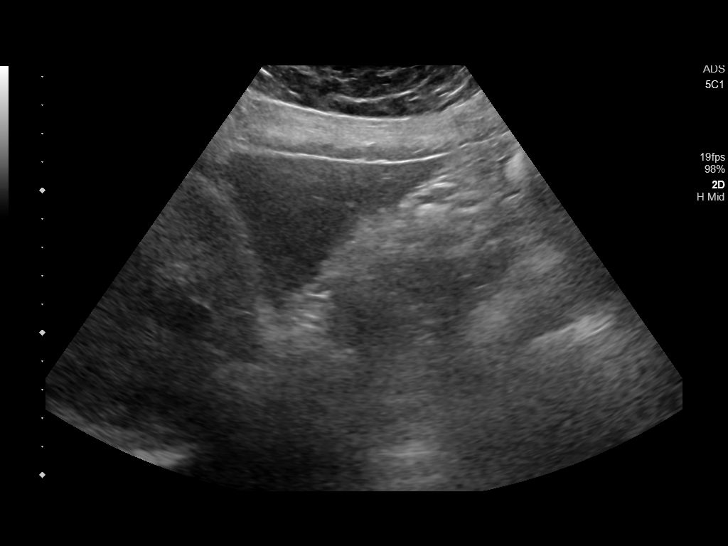
[im 19/35]
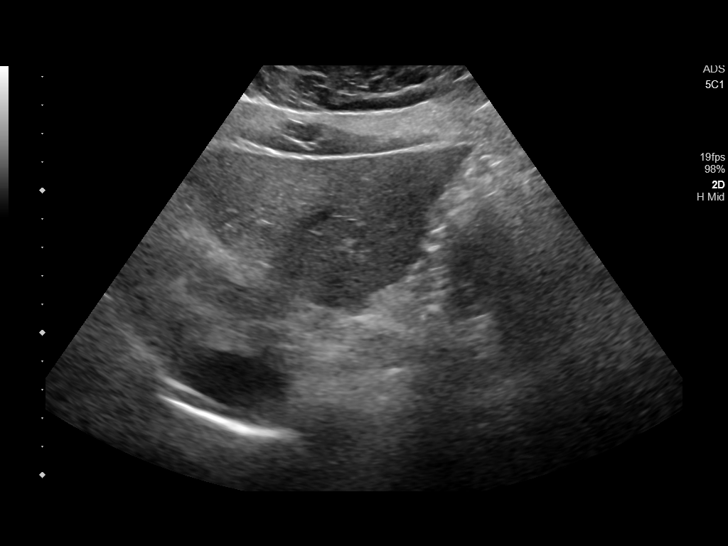
[im 22/35]
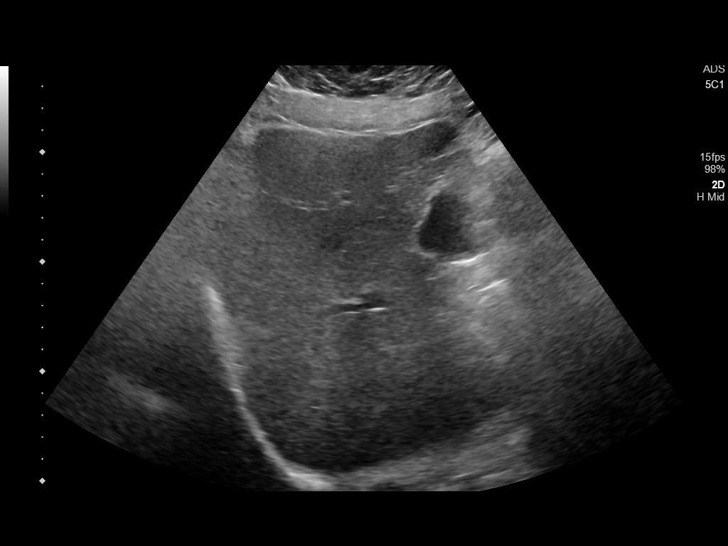
[im 23/35]
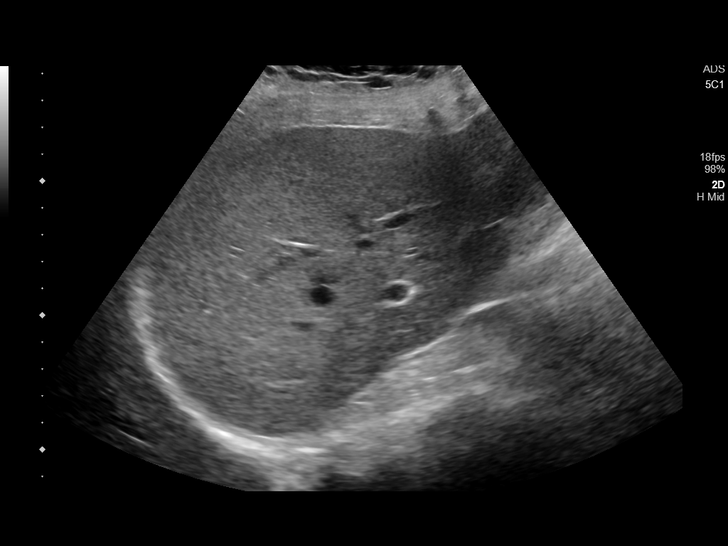
[im 26/35]
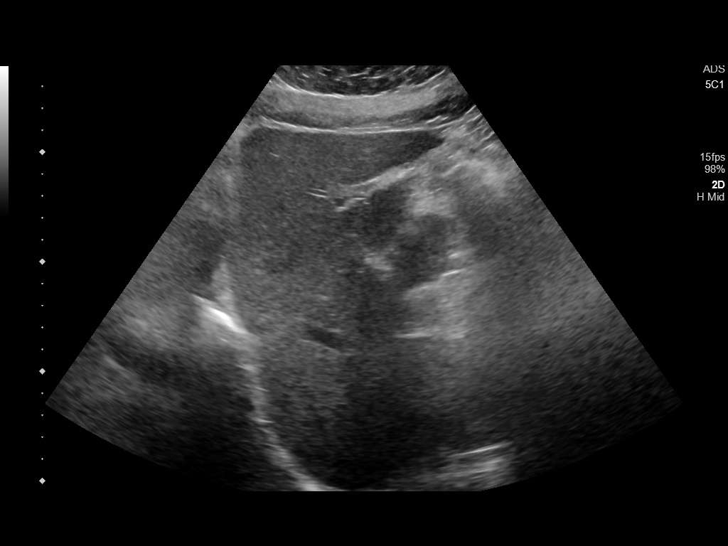
[im 29/35]
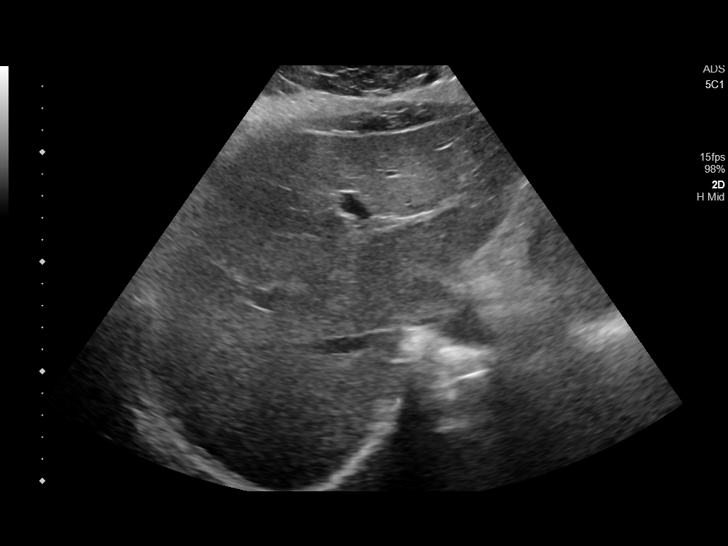
[im 32/35]
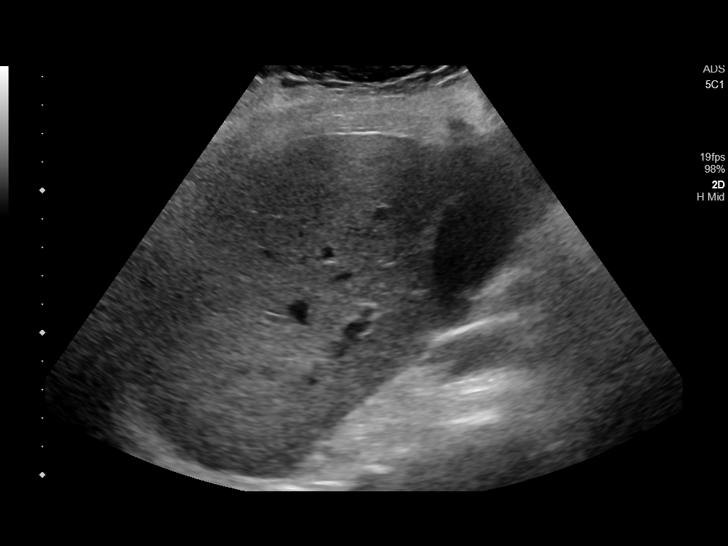
[im 35/35]
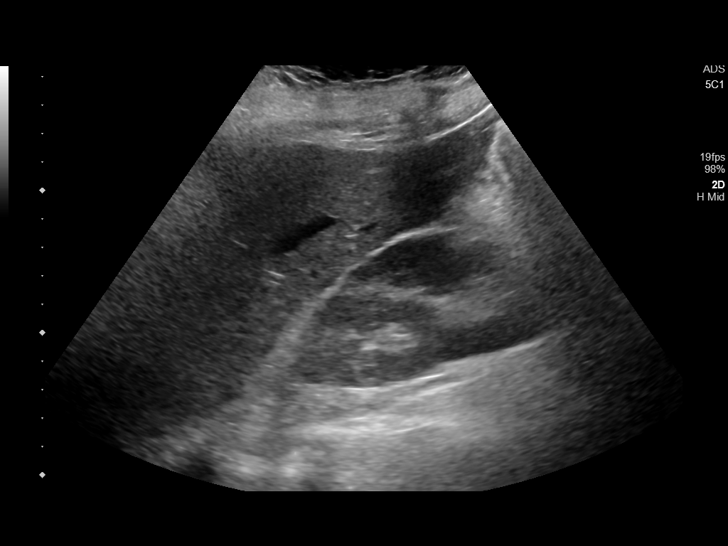

[14 of 25 positions shown; findings below may reference images not displayed]

FINDINGS: Gallbladder:

No gallstones or wall thickening visualized. No sonographic Murphy
sign noted by sonographer.

Common bile duct:

Diameter: 2 millimeters, normal.

Liver:

No focal lesion identified. Within normal limits in parenchymal
echogenicity. Portal vein is patent on color Doppler imaging with
normal direction of blood flow towards the liver.

Other: Negative visible right kidney.
IMPRESSION: Normal right upper quadrant ultrasound.

## 2022-02-23 ENCOUNTER — Other Ambulatory Visit: Payer: Self-pay | Admitting: Otolaryngology

## 2022-02-23 DIAGNOSIS — K1120 Sialoadenitis, unspecified: Secondary | ICD-10-CM

## 2022-02-23 DIAGNOSIS — R221 Localized swelling, mass and lump, neck: Secondary | ICD-10-CM

## 2022-03-01 ENCOUNTER — Other Ambulatory Visit: Payer: Medicaid Other

## 2022-03-08 ENCOUNTER — Ambulatory Visit
Admission: RE | Admit: 2022-03-08 | Discharge: 2022-03-08 | Disposition: A | Discharge status: Home / Self Care | Payer: Medicaid Other | Source: Ambulatory Visit | Attending: Otolaryngology | Admitting: Otolaryngology

## 2022-03-08 DIAGNOSIS — R221 Localized swelling, mass and lump, neck: Secondary | ICD-10-CM

## 2022-03-08 DIAGNOSIS — K1120 Sialoadenitis, unspecified: Secondary | ICD-10-CM

## 2022-03-08 MED ORDER — IOPAMIDOL (ISOVUE-300) INJECTION 61%
75.0000 mL | Freq: Once | INTRAVENOUS | Status: AC | PRN
  Administered 2022-03-08: 75 mL via INTRAVENOUS

## 2022-03-09 ENCOUNTER — Other Ambulatory Visit: Payer: Self-pay | Admitting: Otolaryngology

## 2022-03-09 DIAGNOSIS — E041 Nontoxic single thyroid nodule: Secondary | ICD-10-CM

## 2022-03-13 ENCOUNTER — Ambulatory Visit
Admission: RE | Admit: 2022-03-13 | Discharge: 2022-03-13 | Disposition: A | Discharge status: Home / Self Care | Payer: Medicaid Other | Source: Ambulatory Visit | Attending: Otolaryngology | Admitting: Otolaryngology

## 2022-03-13 DIAGNOSIS — E041 Nontoxic single thyroid nodule: Secondary | ICD-10-CM

## 2022-03-15 ENCOUNTER — Other Ambulatory Visit: Payer: Self-pay | Admitting: Otolaryngology

## 2022-03-15 DIAGNOSIS — E041 Nontoxic single thyroid nodule: Secondary | ICD-10-CM

## 2022-03-22 ENCOUNTER — Ambulatory Visit
Admission: RE | Admit: 2022-03-22 | Discharge: 2022-03-22 | Disposition: A | Discharge status: Home / Self Care | Payer: Medicaid Other | Source: Ambulatory Visit | Attending: Otolaryngology | Admitting: Otolaryngology

## 2022-03-22 DIAGNOSIS — E041 Nontoxic single thyroid nodule: Secondary | ICD-10-CM | POA: Diagnosis present

## 2022-03-23 LAB — CYTOLOGY - NON PAP

## 2023-02-22 ENCOUNTER — Other Ambulatory Visit: Payer: Self-pay

## 2023-02-22 ENCOUNTER — Emergency Department
Admission: EM | Admit: 2023-02-22 | Discharge: 2023-02-22 | Disposition: A | Discharge status: Home / Self Care | Payer: Medicaid Other | Attending: Emergency Medicine | Admitting: Emergency Medicine

## 2023-02-22 DIAGNOSIS — J45909 Unspecified asthma, uncomplicated: Secondary | ICD-10-CM | POA: Insufficient documentation

## 2023-02-22 DIAGNOSIS — R197 Diarrhea, unspecified: Secondary | ICD-10-CM | POA: Diagnosis present

## 2023-02-22 DIAGNOSIS — A084 Viral intestinal infection, unspecified: Secondary | ICD-10-CM | POA: Diagnosis not present

## 2023-02-22 DIAGNOSIS — I10 Essential (primary) hypertension: Secondary | ICD-10-CM | POA: Insufficient documentation

## 2023-02-22 LAB — COMPREHENSIVE METABOLIC PANEL
ALT: 19 U/L (ref 0–44)
AST: 26 U/L (ref 15–41)
Albumin: 4.2 g/dL (ref 3.5–5.0)
Alkaline Phosphatase: 46 U/L (ref 38–126)
Anion gap: 11 (ref 5–15)
BUN: 16 mg/dL (ref 6–20)
CO2: 23 mmol/L (ref 22–32)
Calcium: 8.9 mg/dL (ref 8.9–10.3)
Chloride: 101 mmol/L (ref 98–111)
Creatinine, Ser: 0.82 mg/dL (ref 0.44–1.00)
GFR, Estimated: >60 mL/min (ref >60)
Glucose, Bld: 132 mg/dL — ABNORMAL HIGH (ref 70–99)
Potassium: 3.5 mmol/L (ref 3.5–5.1)
Sodium: 135 mmol/L (ref 135–145)
Total Bilirubin: 0.7 mg/dL (ref 0.3–1.2)
Total Protein: 8.2 g/dL — ABNORMAL HIGH (ref 6.5–8.1)

## 2023-02-22 LAB — CBC
HCT: 39.8 % (ref 36.0–46.0)
Hemoglobin: 13 g/dL (ref 12.0–15.0)
MCH: 31.4 pg (ref 26.0–34.0)
MCHC: 32.7 g/dL (ref 30.0–36.0)
MCV: 96.1 fL (ref 80.0–100.0)
Platelets: 229 10*3/uL (ref 150–400)
RBC: 4.14 MIL/uL (ref 3.87–5.11)
RDW: 12.5 % (ref 11.5–15.5)
WBC: 5.4 10*3/uL (ref 4.0–10.5)
nRBC: 0 % (ref 0.0–0.2)

## 2023-02-22 LAB — URINALYSIS, ROUTINE W REFLEX MICROSCOPIC
Bilirubin Urine: NEGATIVE
Glucose, UA: NEGATIVE mg/dL
Hgb urine dipstick: NEGATIVE
Ketones, ur: 5 mg/dL — AB
Leukocytes,Ua: NEGATIVE
Nitrite: NEGATIVE
Protein, ur: 100 mg/dL — AB
Specific Gravity, Urine: 1.034 — ABNORMAL HIGH (ref 1.005–1.030)
Squamous Epithelial / HPF: >50 /HPF (ref 0–5)
pH: 5 (ref 5.0–8.0)

## 2023-02-22 LAB — LIPASE, BLOOD: Lipase: 27 U/L (ref 11–51)

## 2023-02-22 MED ORDER — KETOROLAC TROMETHAMINE 15 MG/ML IJ SOLN
15.0000 mg | Freq: Once | INTRAMUSCULAR | Status: AC
  Administered 2023-02-22: 15 mg via INTRAVENOUS
  Filled 2023-02-22: qty 1

## 2023-02-22 MED ORDER — LACTATED RINGERS IV BOLUS
1000.0000 mL | Freq: Once | INTRAVENOUS | Status: AC
  Administered 2023-02-22: 1000 mL via INTRAVENOUS

## 2023-02-22 MED ORDER — NAPROXEN 500 MG PO TABS: 500.0000 mg | ORAL_TABLET | Freq: Two times a day (BID) | ORAL | 0 refills | Status: DC

## 2023-02-22 MED ORDER — ONDANSETRON 4 MG PO TBDP: 4.0000 mg | ORAL_TABLET | Freq: Three times a day (TID) | ORAL | 0 refills | Status: DC | PRN

## 2023-02-22 MED ORDER — LOPERAMIDE HCL 2 MG PO TABS: 4.0000 mg | ORAL_TABLET | Freq: Four times a day (QID) | ORAL | 0 refills | Status: DC | PRN

## 2023-02-22 MED ORDER — ONDANSETRON HCL 4 MG/2ML IJ SOLN
4.0000 mg | Freq: Once | INTRAMUSCULAR | Status: AC
  Administered 2023-02-22: 4 mg via INTRAVENOUS
  Filled 2023-02-22: qty 2

## 2023-02-22 MED ORDER — PANTOPRAZOLE SODIUM 40 MG IV SOLR
40.0000 mg | Freq: Once | INTRAVENOUS | Status: AC
  Administered 2023-02-22: 40 mg via INTRAVENOUS
  Filled 2023-02-22: qty 10

## 2023-02-22 NOTE — ED Triage Notes (Signed)
Pt to ED via POV c/o lower abd pain, N/V, and diarrhea. Pt states this started Thursday. Abd pain shoots down to legs sometimes. Had diarrhea today. Hasn't been eating much since thursday. Denies CP, SOB, dizziness.

## 2023-02-22 NOTE — ED Provider Notes (Addendum)
[facility] Provider Note    Event Date/Time   First MD Initiated Contact with Patient 02/22/23 2036     (approximate)   History   Chief Complaint: Abdominal Pain and Emesis   HPI  Mary Hicks is a 42 y.o. female with a history of hypertension, asthma who comes ED complaining of generalized abdominal cramping pain, nausea vomiting and diarrhea since yesterday.  Multiple family members are sick with similar symptoms.  No chest pain shortness of breath or fever.  She does have some chills.  Has not been able to eat or drink today, has not been able to take her blood pressure medicine since yesterday.     Physical Exam   Triage Vital Signs: ED Triage Vitals  Enc Vitals Group     BP 02/22/23 1936 (!) 187/122     Pulse Rate 02/22/23 1936 83     Resp 02/22/23 1936 16     Temp 02/22/23 1936 98.1 F (36.7 C)     Temp Source 02/22/23 1936 Oral     SpO2 02/22/23 1936 100 %     Weight 02/22/23 1941 230 lb (104.3 kg)     Height 02/22/23 1941 5\' 7"  (1.702 m)     Head Circumference --      Peak Flow --      Pain Score 02/22/23 1941 10     Pain Loc --      Pain Edu? --      Excl. in GC? --     Most recent vital signs: Vitals:   02/22/23 1936 02/22/23 2100  BP: (!) 187/122 (!) 191/107  Pulse: 83 (!) 56  Resp: 16 18  Temp: 98.1 F (36.7 C)   SpO2: 100% 99%    General: Awake, no distress.  CV:  Good peripheral perfusion.  Regular rate rhythm Resp:  Normal effort.  Clear to auscultation bilaterally Abd:  No distention.  Soft, no focal tenderness Other:  Somewhat dry mucous membranes.  No rash.   ED Results / Procedures / Treatments   Labs (all labs ordered are listed, but only abnormal results are displayed) Labs Reviewed  COMPREHENSIVE METABOLIC PANEL - Abnormal; Notable for the following components:      Result Value   Glucose, Bld 132 (*)    Total Protein 8.2 (*)    All other components within normal limits  URINALYSIS, ROUTINE W  REFLEX MICROSCOPIC - Abnormal; Notable for the following components:   Color, Urine YELLOW (*)    APPearance TURBID (*)    Specific Gravity, Urine 1.034 (*)    Ketones, ur 5 (*)    Protein, ur 100 (*)    Bacteria, UA MANY (*)    All other components within normal limits  LIPASE, BLOOD  CBC     EKG    RADIOLOGY    PROCEDURES:  Procedures   MEDICATIONS ORDERED IN ED: Medications  lactated ringers bolus 1,000 mL (0 mLs Intravenous Stopped 02/22/23 2251)  ondansetron (ZOFRAN) injection 4 mg (4 mg Intravenous Given 02/22/23 2109)  ketorolac (TORADOL) 15 MG/ML injection 15 mg (15 mg Intravenous Given 02/22/23 2109)  pantoprazole (PROTONIX) injection 40 mg (40 mg Intravenous Given 02/22/23 2109)     IMPRESSION / MDM / ASSESSMENT AND PLAN / ED COURSE  I reviewed the triage vital signs and the nursing notes.  DDx: Viral gastroenteritis, AKI, electrolyte abnormality, pancreatitis, dehydration  Patient's presentation is most consistent with acute presentation with potential threat to life or bodily  function.  Patient presents with vomiting diarrhea and p.o. intolerance for 2 days.  Vital signs unremarkable except for uncontrolled hypertension due to not being able to take her oral antihypertensive.  Will place IV, give IV Toradol Zofran Protonix and fluids for symptom relief.  If tolerating oral intake she is stable for discharge home.   ----------------------------------------- 11:21 PM on 02/22/2023 ----------------------------------------- Symptoms resolved, tolerating oral intake, stable for discharge      FINAL CLINICAL IMPRESSION(S) / ED DIAGNOSES   Final diagnoses:  Viral gastroenteritis     Rx / DC Orders   ED Discharge Orders          Ordered    ondansetron (ZOFRAN-ODT) 4 MG disintegrating tablet  Every 8 hours PRN        02/22/23 2200    loperamide (IMODIUM A-D) 2 MG tablet  4 times daily PRN        02/22/23 2200    naproxen (NAPROSYN) 500 MG tablet  2  times daily with meals        02/22/23 2200             Note:  This document was prepared using Dragon voice recognition software and may include unintentional dictation errors.   Sharman Cheek, MD 02/22/23 2202    Sharman Cheek, MD 02/22/23 [phone]

## 2023-02-22 NOTE — ED Notes (Signed)
Pt given water for PO challenge 

## 2023-10-02 ENCOUNTER — Other Ambulatory Visit: Payer: Self-pay | Admitting: Otolaryngology

## 2023-10-02 DIAGNOSIS — E042 Nontoxic multinodular goiter: Secondary | ICD-10-CM

## 2023-10-26 IMAGING — CT CT NECK W/ CM
3 of 4 series · 13 of 33 positions shown, 16 images · IV contrast (agent unspecified)
Comparison: None.

CLINICAL DATA: Left neck swelling for years

EXAM:
CT NECK WITH CONTRAST
TECHNIQUE: Multidetector CT imaging of the neck was performed using the
standard protocol following the bolus administration of intravenous
contrast.

[Series 5: neck 2.0 st · sagittal · 0.45mm/px · 5 of 102 slices shown, 6 images (1 of 2)]
[im 34/102  bone]
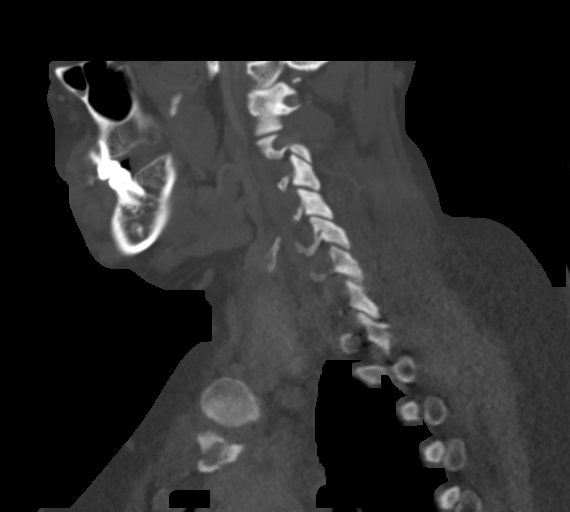
[im 43/102  bone]
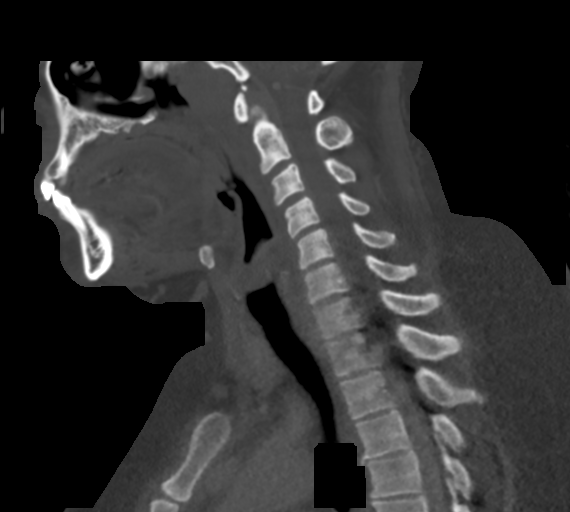
[im 51/102  soft-tissue]
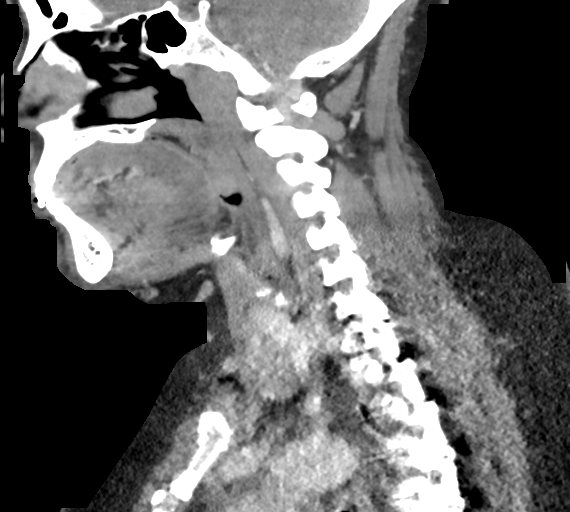
[im 51/102  bone]
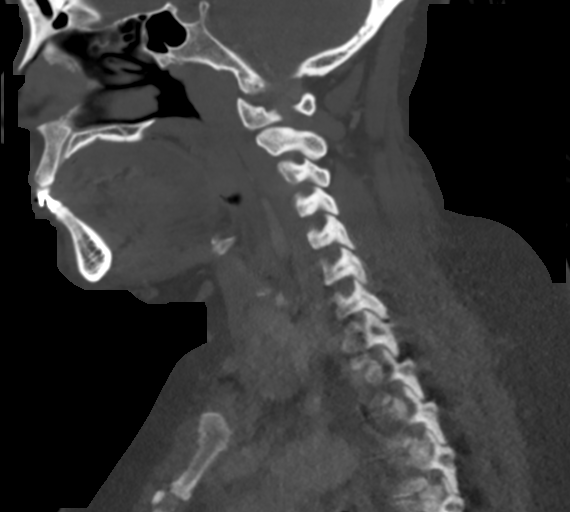
[im 59/102  bone]
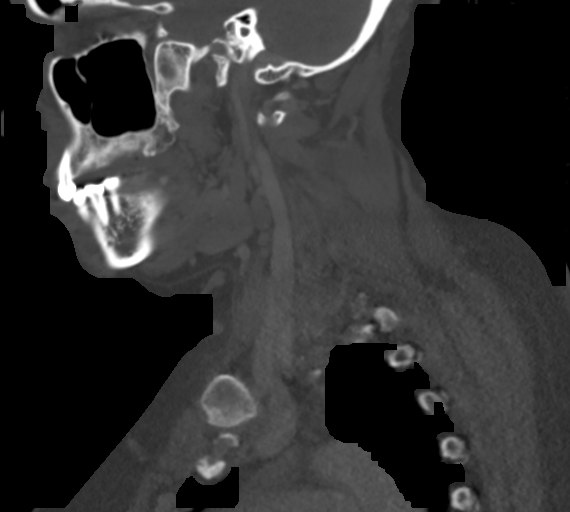
[im 68/102  bone]
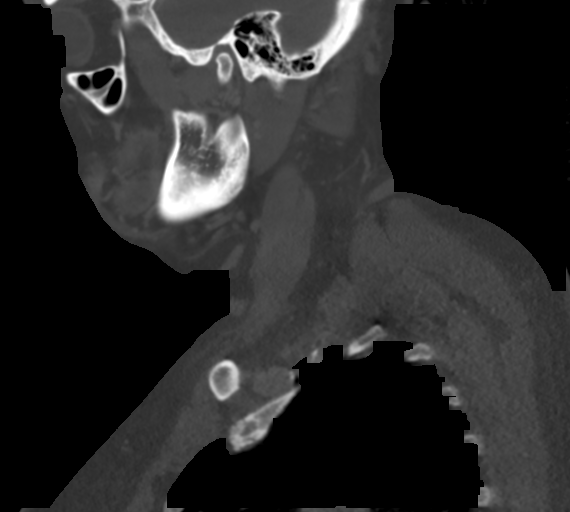

[Series 6: neck 2.0 st · coronal · 0.45mm/px · 3 of 109 slices shown (2 of 2)]
[im 22/109  bone]
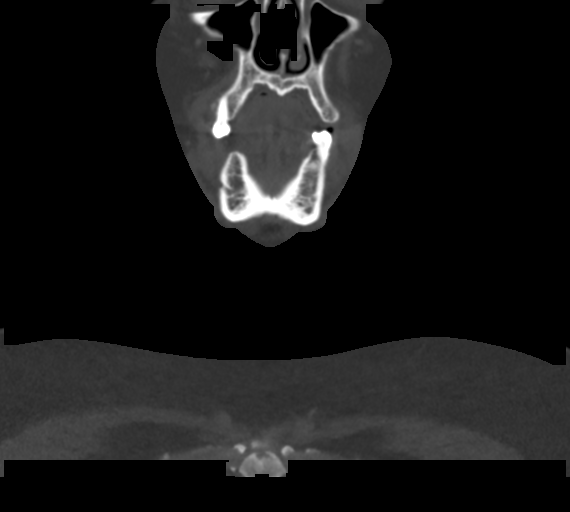
[im 44/109  bone]
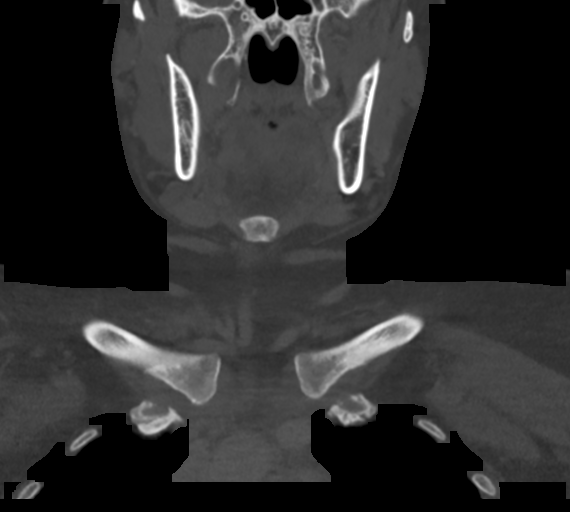
[im 65/109  bone]
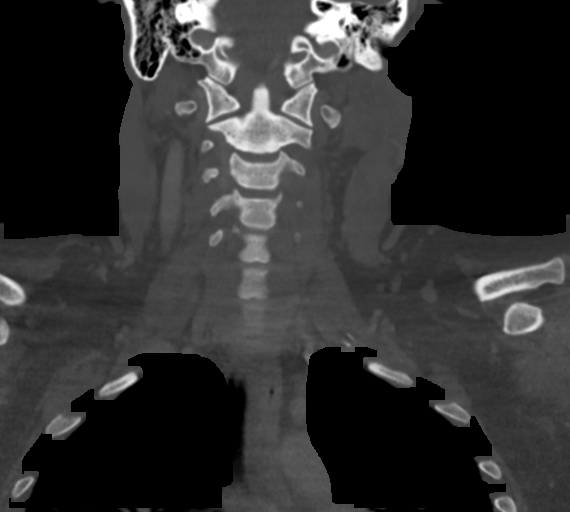

[Series 7: neck 2.0 st orthogonal · axial · 0.39mm/px · z∈[-340,-193]mm · 5 of 116 slices shown, 7 images]
[im 20/116  soft-tissue]
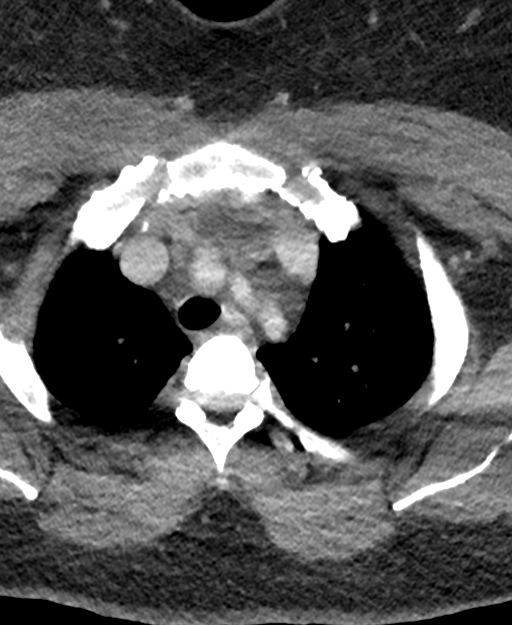
[im 20/116  bone]
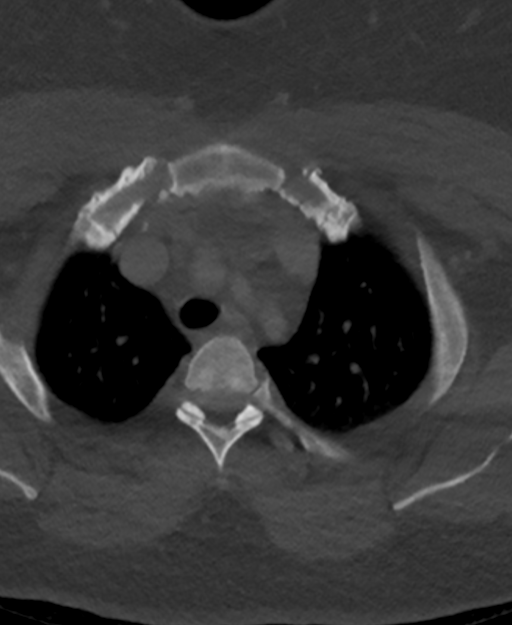
[im 39/116  bone]
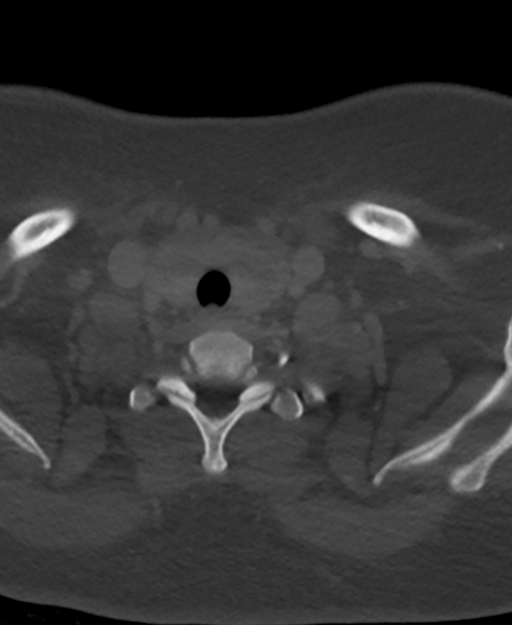
[im 58/116  bone]
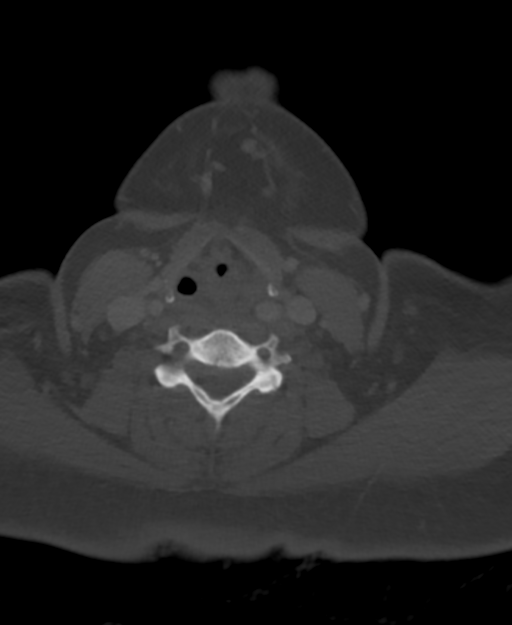
[im 77/116  bone]
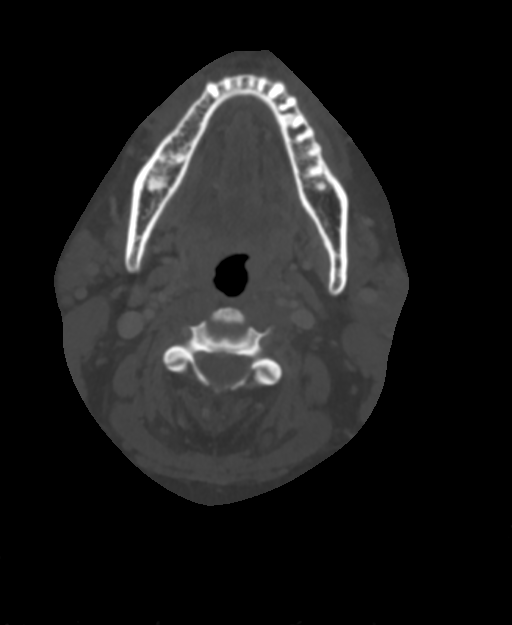
[im 96/116  soft-tissue]
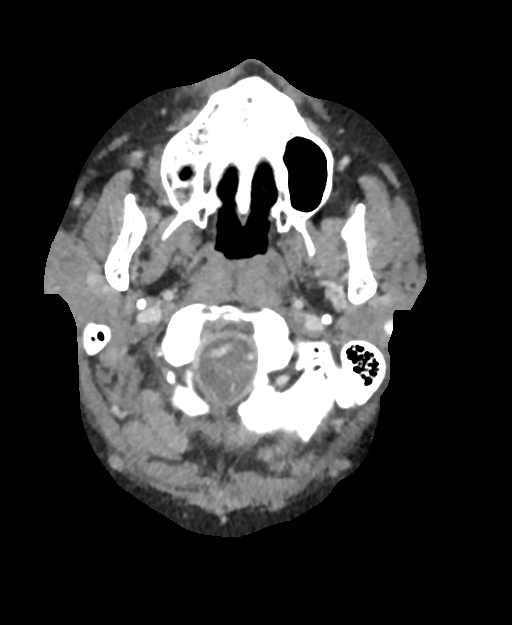
[im 96/116  bone]
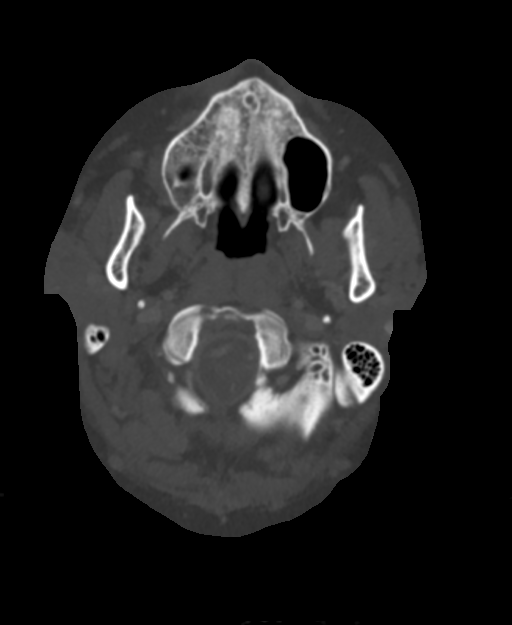

[13 of 33 positions shown; findings below may reference images not displayed]

RADIATION DOSE REDUCTION: This exam was performed according to the
departmental dose-optimization program which includes automated
exposure control, adjustment of the mA and/or kV according to
patient size and/or use of iterative reconstruction technique.

CONTRAST:  75mL MM6FOP-WLL IOPAMIDOL (MM6FOP-WLL) INJECTION 61%
FINDINGS: Pharynx and larynx: The nasal cavity and nasopharynx are
unremarkable.

The oral cavity and oropharynx are unremarkable. The parapharyngeal
spaces are clear.

The hypopharynx and larynx are unremarkable. The vocal folds are
normal.

There is no abnormal enhancement, soft tissue mass or fluid
collection.

Salivary glands: Parotid and submandibular glands are unremarkable.

Thyroid: Thyroid is enlarged and heterogeneous with a 2.2 cm left
nodule.

Lymph nodes: There is no pathologic lymphadenopathy in the neck.

Vascular: The major vasculature of the neck is normal.

Limited intracranial: The imaged intracranial compartment is
unremarkable.

Visualized orbits: The imaged globes and orbits are unremarkable.

Mastoids and visualized paranasal sinuses: The imaged paranasal
sinuses and mastoid air cells are clear.

Skeleton: There is no acute osseous abnormality or suspicious
osseous lesion.

Upper chest: The imaged lung apices are clear.

Other: There is no abnormal soft tissue swelling in the left neck.
No mass or fluid collection is identified.
IMPRESSION: 1. 2.2 cm left thyroid nodule. Recommend thyroid ultrasound for
further evaluation.
2. Otherwise, unremarkable CT of the neck. No other soft tissue
mass, fluid collection, or lymphadenopathy.

## 2023-10-31 IMAGING — US US THYROID
1 series · 12 of 25 positions shown · non-contrast
Comparison: 03/08/2022 neck CT

CLINICAL DATA: Left inferior thyroid nodule by neck CT

EXAM:
THYROID ULTRASOUND
TECHNIQUE: Ultrasound examination of the thyroid gland and adjacent soft
tissues was performed.

[Series 1: us thyroid · 0.05mm/px · 12 of 91 slices shown]
[im 4/91]
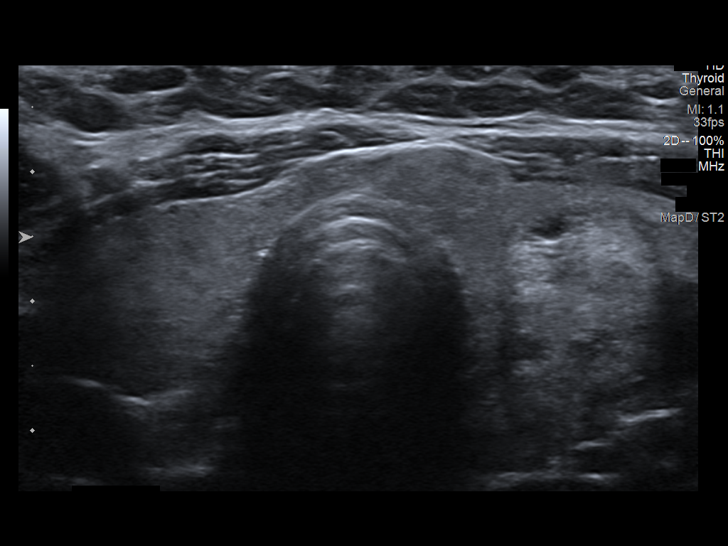
[im 12/91]
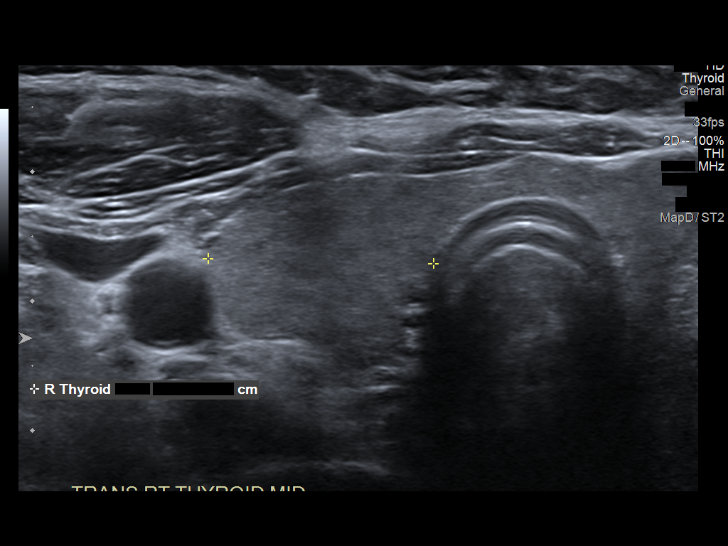
[im 19/91]
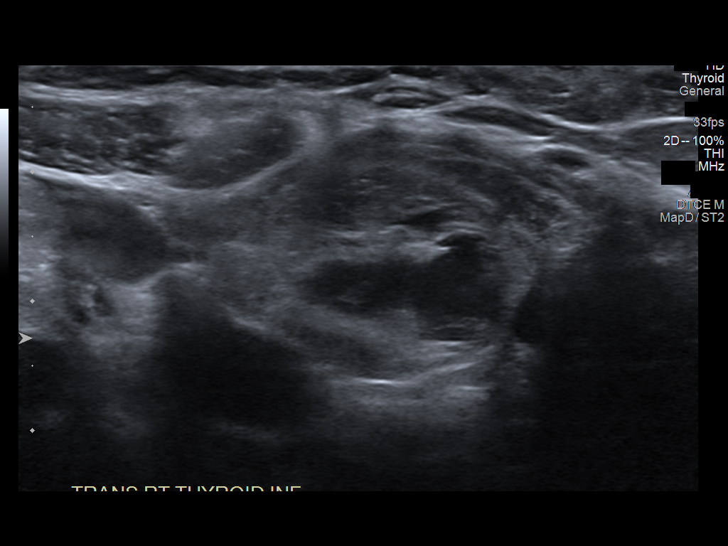
[im 27/91]
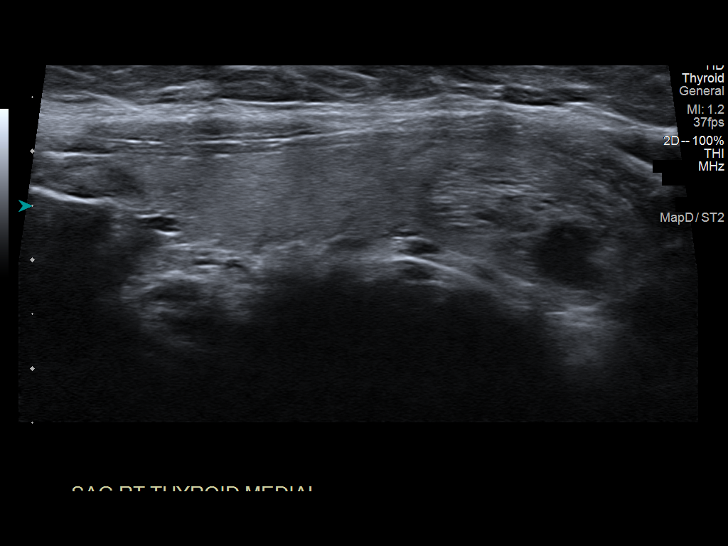
[im 34/91]
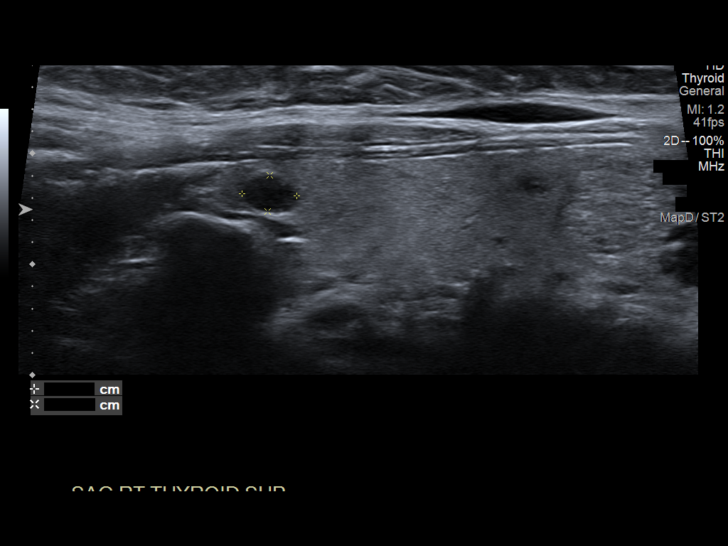
[im 42/91]
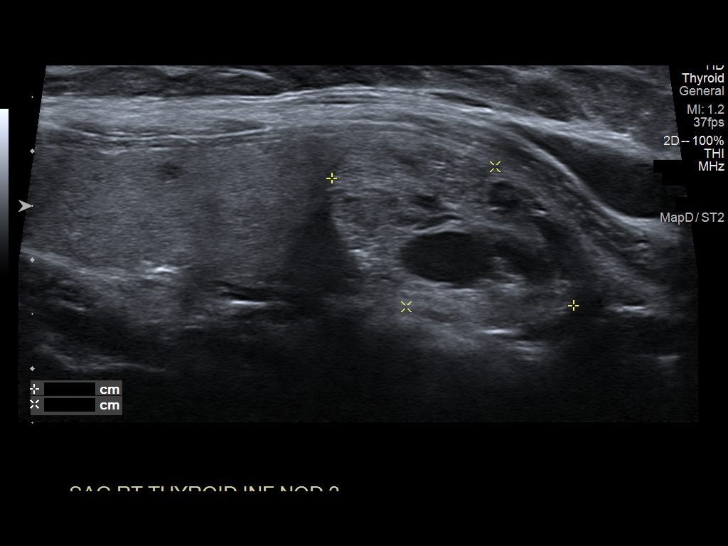
[im 49/91]
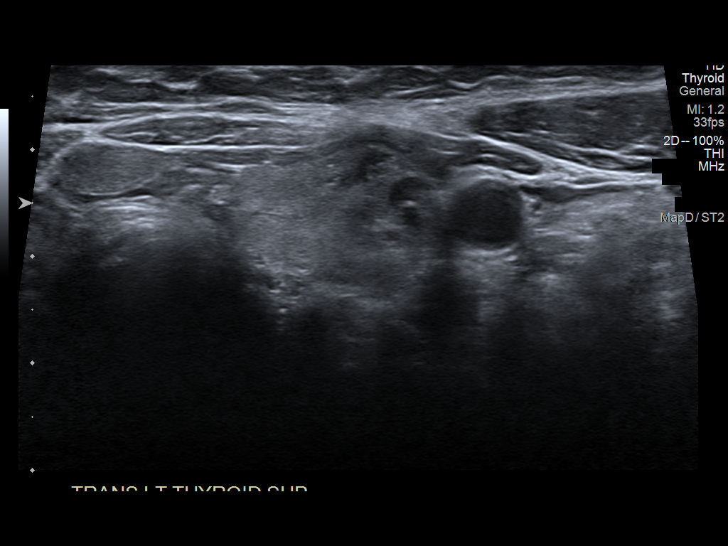
[im 57/91]
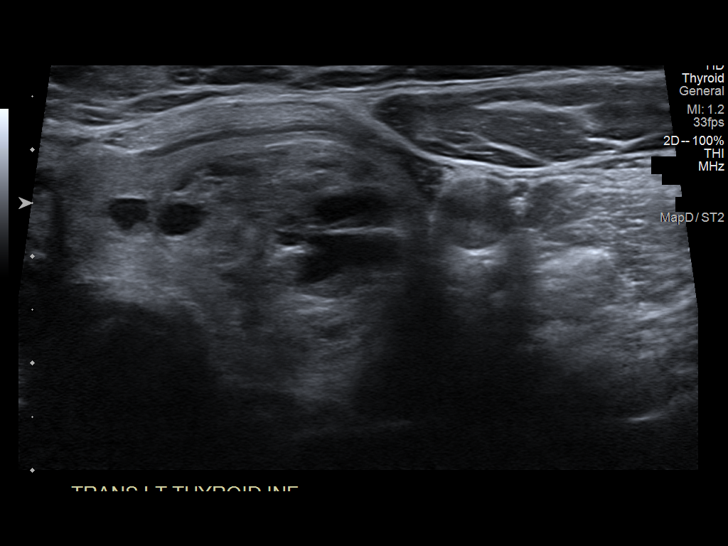
[im 64/91]
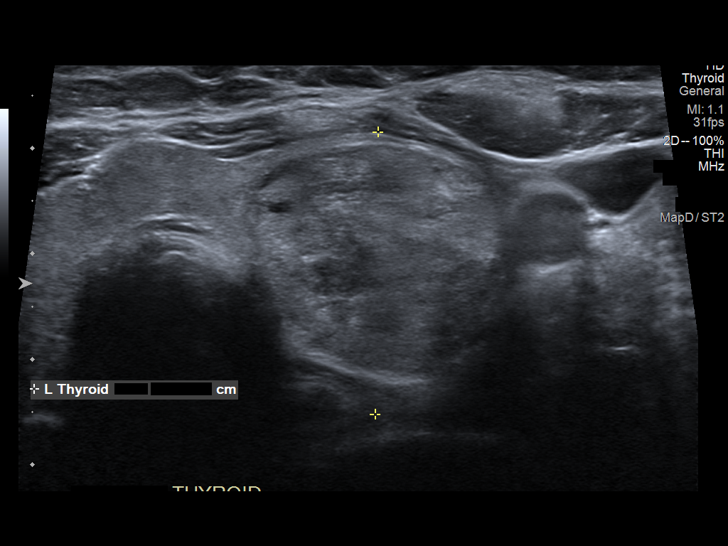
[im 72/91]
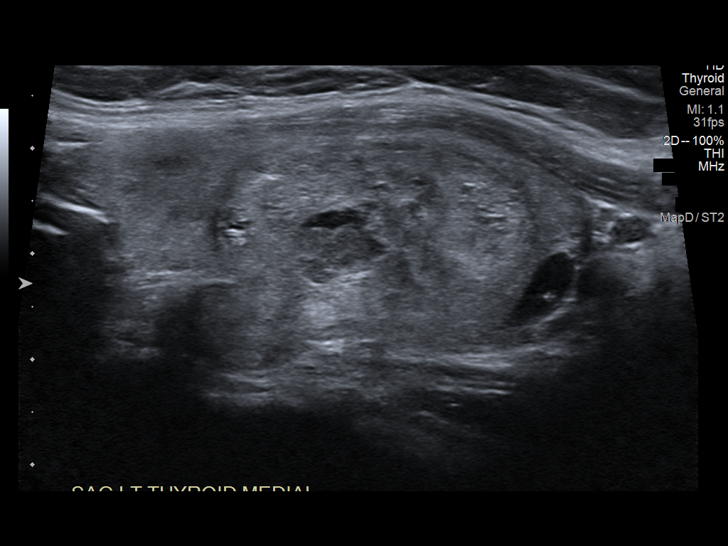
[im 79/91]
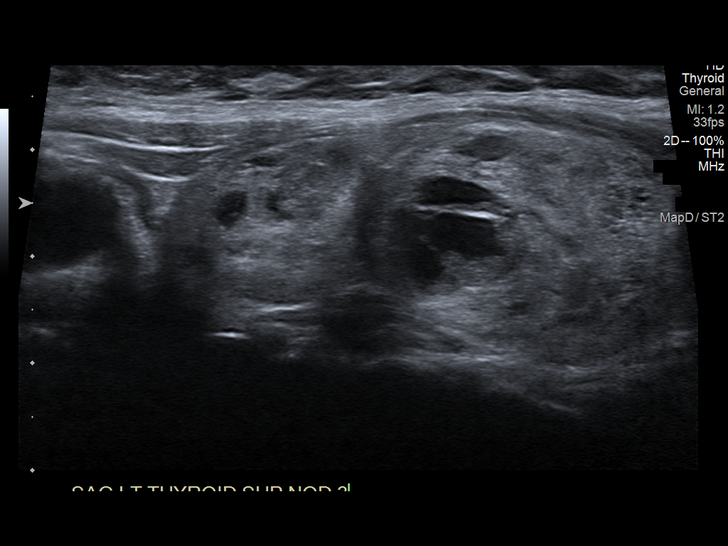
[im 87/91]
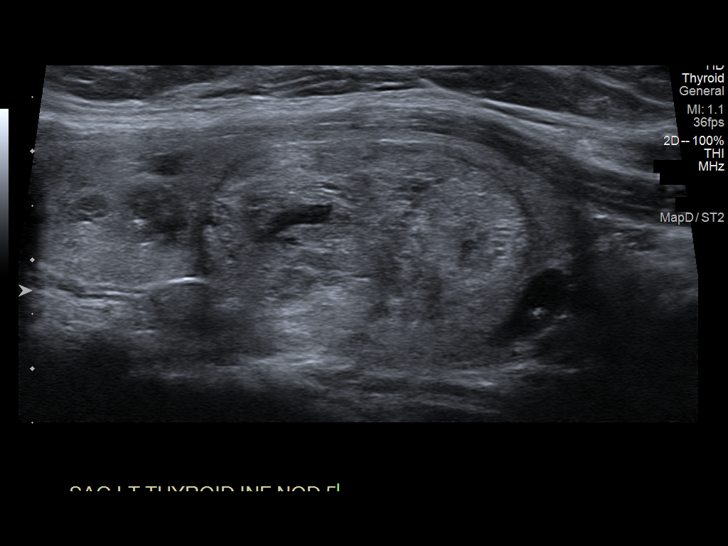

[12 of 25 positions shown; findings below may reference images not displayed]

FINDINGS: Parenchymal Echotexture: Mildly heterogenous

Isthmus: 7 mm

Right lobe: 5.6 x 1.6 x 1.7 cm

Left lobe: 5.3 x 2.7 x 2.3 cm

_________________________________________________________

Estimated total number of nodules >/= 1 cm: 4

Number of spongiform nodules >/=  2 cm not described below (TR1): 0

Number of mixed cystic and solid nodules >/= 1.5 cm not described
below (TR2): 0

_________________________________________________________

Nodule # 1:

Location: Right; Mid

Maximum size: 1.1 cm; Other 2 dimensions: 1.1 x 1.0 cm

Composition: solid/almost completely solid (2)

Echogenicity: isoechoic (1)

Shape: not taller-than-wide (0)

Margins: ill-defined (0)

Echogenic foci: none (0)

ACR TI-RADS total points: 3.

ACR TI-RADS risk category: TR3 (3 points).

ACR TI-RADS recommendations:

Given size (<1.4 cm) and appearance, this nodule does NOT meet
TI-RADS criteria for biopsy or dedicated follow-up.

_________________________________________________________

Nodule # 2:

Location: Right; Inferior

Maximum size: 2.5 cm; Other 2 dimensions: 1.5 x 1.9 cm

Composition: mixed cystic and solid (1)

Echogenicity: isoechoic (1)

Shape: not taller-than-wide (0)

Margins: ill-defined (0)

Echogenic foci: none (0)

ACR TI-RADS total points: 2.

ACR TI-RADS risk category: TR2 (2 points).

ACR TI-RADS recommendations:

This nodule does NOT meet TI-RADS criteria for biopsy or dedicated
follow-up.

_________________________________________________________

Nodule # 3:

Location: Left; Superior

Maximum size: 1.5 cm; Other 2 dimensions: 1.4 x 1.4 cm

Composition: mixed cystic and solid (1)

Echogenicity: isoechoic (1)

Shape: not taller-than-wide (0)

Margins: ill-defined (0)

Echogenic foci: none (0)

ACR TI-RADS total points: 2.

ACR TI-RADS risk category: TR2 (2 points).

ACR TI-RADS recommendations:

This nodule does NOT meet TI-RADS criteria for biopsy or dedicated
follow-up.

_________________________________________________________

Nodule # 4:

Location: Left; Inferior

Maximum size: 3.1 cm; Other 2 dimensions: 2.1 x 2.5 cm

Composition: solid/almost completely solid (2)

Echogenicity: isoechoic (1)

Shape: not taller-than-wide (0)

Margins: ill-defined (0)

Echogenic foci: none (0)

ACR TI-RADS total points: 3.

ACR TI-RADS risk category: TR3 (3 points).

ACR TI-RADS recommendations:

**Given size (>/= 2.5 cm) and appearance, fine needle aspiration of
this mildly suspicious nodule should be considered based on TI-RADS
criteria.

_________________________________________________________

There are a few additional scattered bilateral subcentimeter benign
cystic nodules noted all measuring 9 mm or less in size.

No hypervascularity.  No regional adenopathy.
IMPRESSION: 3.1 cm left inferior TR 3 nodule meets criteria for biopsy as above
(nodule 4). This correlates with the neck CT finding.

Additional nodules noted as above.

The above is in keeping with the ACR TI-RADS recommendations - [HOSPITAL] 5579;[DATE].

## 2023-11-09 IMAGING — US US FNA BIOPSY THYROID 1ST LESION
1 series · 13 of 14 positions shown · non-contrast
Comparison: US Thyroid 03/13/22

MEDICATIONS:
4 mL 1% lidocaine

COMPLICATIONS:
None immediate.

INDICATION: Indeterminate left inferior thyroid nodule. Request for fine needle
aspiration.

EXAM:
ULTRASOUND GUIDED FINE NEEDLE ASPIRATION OF INDETERMINATE THYROID
NODULE
TECHNIQUE: Informed written consent was obtained from the patient after a
discussion of the risks, benefits and alternatives to treatment.
Questions regarding the procedure were encouraged and answered. A
timeout was performed prior to the initiation of the procedure.

[Series 1: us fna bx thyroid 1st lesion afirma · 14 acquisitions, 13 frames shown]
[im 1/14]
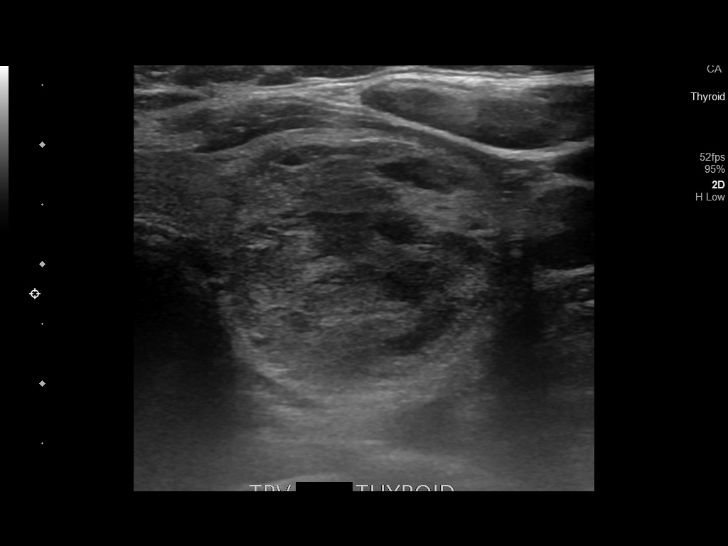
[im 2/14]
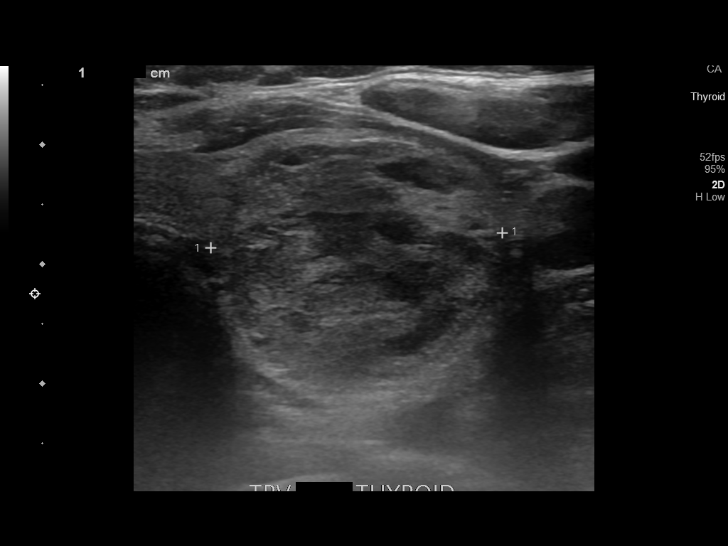
[im 3/14]
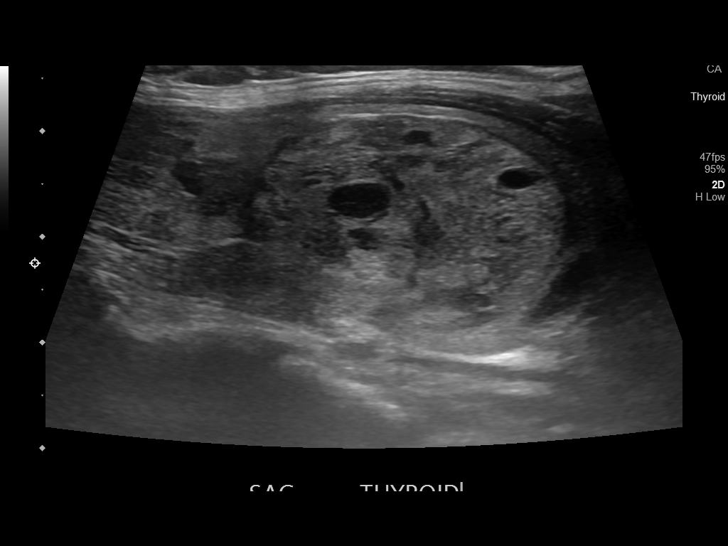
[im 4/14]
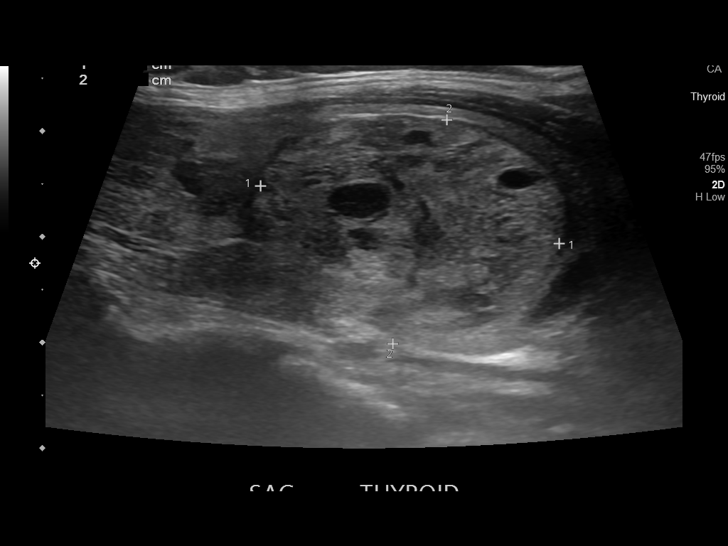
[im 5/14]
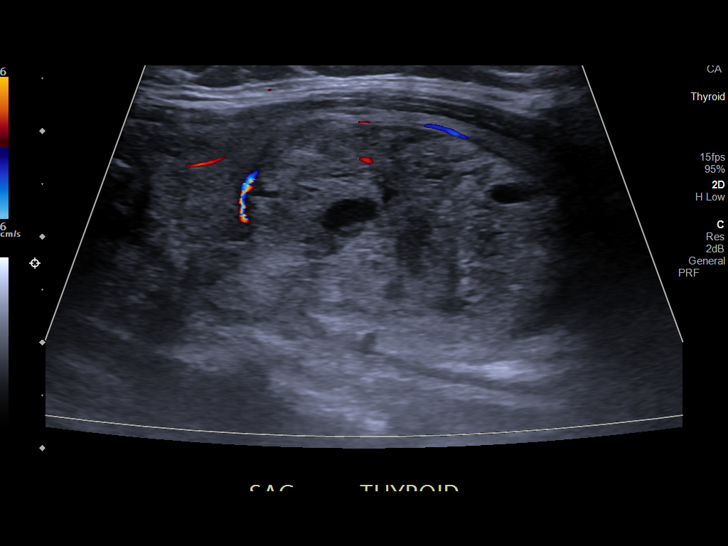
[im 6/14]
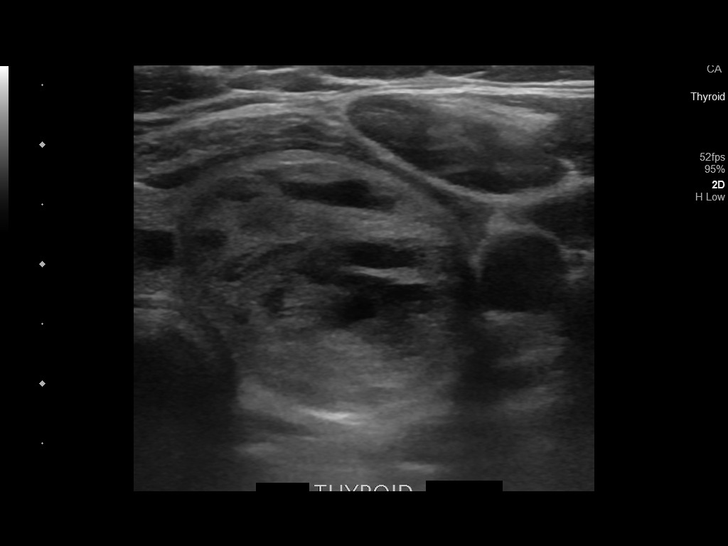
[im 8/14]
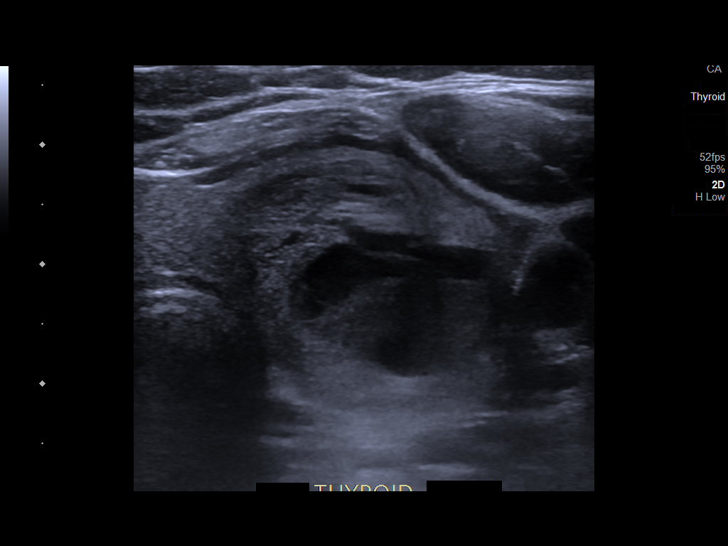
[im 9/14]
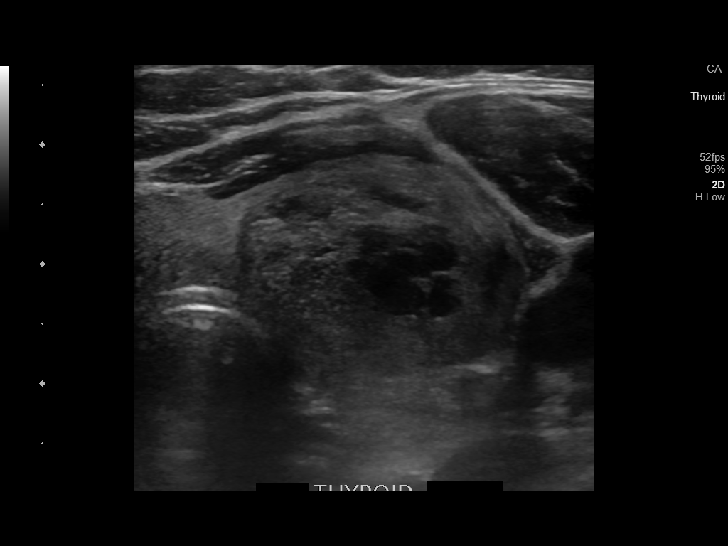
[im 10/14]
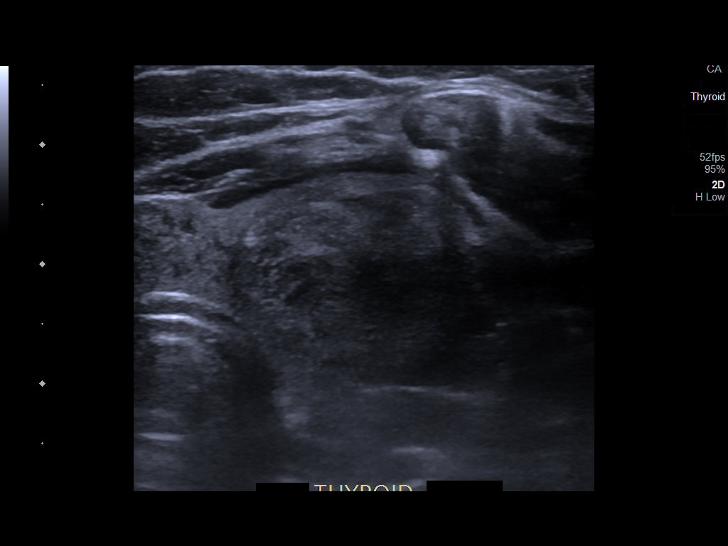
[im 11/14]
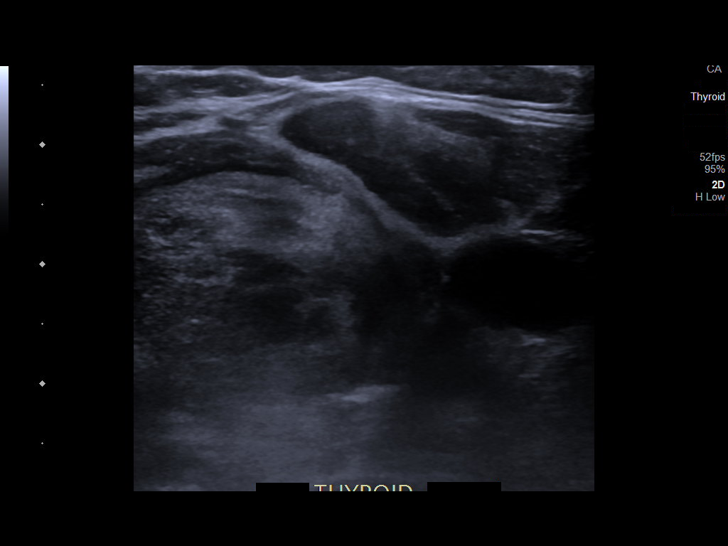
[im 12/14]
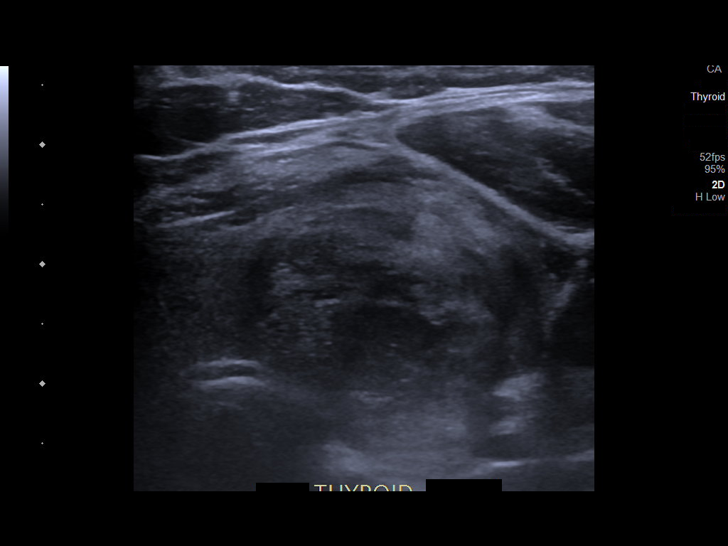
[im 13/14]
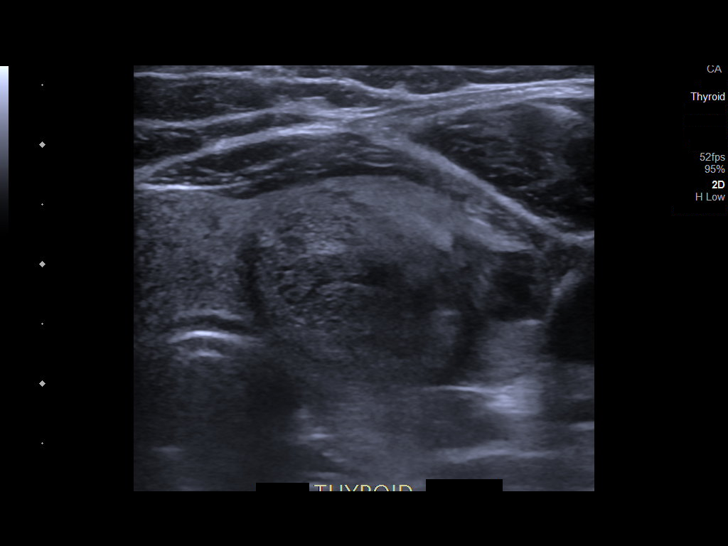
[im 14/14]
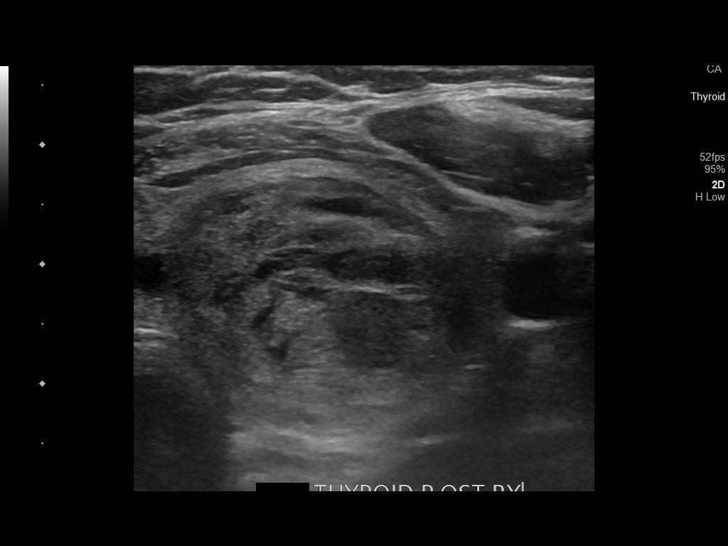

[13 of 14 positions shown; findings below may reference images not displayed]

Pre-procedural ultrasound scanning demonstrated unchanged size and
appearance of the indeterminate nodule within the left inferior
thyroid lobe

The procedure was planned. The neck was prepped in the usual sterile
fashion, and a sterile drape was applied covering the operative
field. A timeout was performed prior to the initiation of the
procedure. Local anesthesia was provided with 1% lidocaine.

Under direct ultrasound guidance, 7 FNA biopsies were performed of
the left inferior thyroid nodule with a 27 gauge needle. Multiple
ultrasound images were saved for procedural documentation purposes.
The samples were prepared and submitted to pathology.

Limited post procedural scanning was negative for hematoma or
additional complication. Dressings were placed. The patient
tolerated the above procedures procedure well without immediate
postprocedural complication.
FINDINGS: Nodule reference number based on prior diagnostic ultrasound: 4

Maximum size: 3.1 cm

Location: Left; Inferior

ACR TI-RADS risk category: TR3 (3 points)

Reason for biopsy: meets ACR TI-RADS criteria

Ultrasound imaging confirms appropriate placement of the needles
within the thyroid nodule.
IMPRESSION: Technically successful ultrasound guided fine needle aspiration of
indeterminate left inferior thyroid lobe nodule.

Performed by Tiger, Gargi

## 2023-11-18 ENCOUNTER — Other Ambulatory Visit: Payer: Self-pay

## 2023-11-18 ENCOUNTER — Emergency Department
Admission: EM | Admit: 2023-11-18 | Discharge: 2023-11-18 | Disposition: A | Discharge status: Home / Self Care | Payer: Medicaid Other | Attending: Emergency Medicine | Admitting: Emergency Medicine

## 2023-11-18 DIAGNOSIS — M25561 Pain in right knee: Secondary | ICD-10-CM | POA: Diagnosis present

## 2023-11-18 DIAGNOSIS — G8929 Other chronic pain: Secondary | ICD-10-CM | POA: Insufficient documentation

## 2023-11-18 DIAGNOSIS — I10 Essential (primary) hypertension: Secondary | ICD-10-CM | POA: Insufficient documentation

## 2023-11-18 DIAGNOSIS — M25562 Pain in left knee: Secondary | ICD-10-CM | POA: Diagnosis not present

## 2023-11-18 DIAGNOSIS — J45909 Unspecified asthma, uncomplicated: Secondary | ICD-10-CM | POA: Diagnosis not present

## 2023-11-18 MED ORDER — MELOXICAM 7.5 MG PO TABS: 7.5000 mg | ORAL_TABLET | Freq: Every day | ORAL | 0 refills | Status: AC

## 2023-11-18 MED ORDER — MELOXICAM 7.5 MG PO TABS: 7.5000 mg | ORAL_TABLET | Freq: Every day | ORAL | 0 refills | Status: DC

## 2023-11-18 MED ORDER — HYDROCODONE-ACETAMINOPHEN 5-325 MG PO TABS: 1.0000 | ORAL_TABLET | Freq: Three times a day (TID) | ORAL | 0 refills | Status: AC | PRN

## 2023-11-18 MED ORDER — CYCLOBENZAPRINE HCL 5 MG PO TABS: 5.0000 mg | ORAL_TABLET | Freq: Three times a day (TID) | ORAL | 0 refills | Status: DC | PRN

## 2023-11-18 NOTE — Discharge Instructions (Addendum)
Take the prescription meds as directed. Follow-up with Carmon Ginsberg for further evaluation and management.
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
## 2023-11-25 ENCOUNTER — Ambulatory Visit
Admission: RE | Admit: 2023-11-25 | Discharge: 2023-11-25 | Disposition: A | Discharge status: Home / Self Care | Payer: Medicaid Other | Source: Ambulatory Visit | Attending: Otolaryngology | Admitting: Otolaryngology

## 2023-11-25 DIAGNOSIS — E042 Nontoxic multinodular goiter: Secondary | ICD-10-CM

## 2023-12-26 ENCOUNTER — Other Ambulatory Visit: Payer: Self-pay | Admitting: Physician Assistant

## 2023-12-26 DIAGNOSIS — Z1231 Encounter for screening mammogram for malignant neoplasm of breast: Secondary | ICD-10-CM

## 2024-01-28 ENCOUNTER — Emergency Department
Admission: EM | Admit: 2024-01-28 | Discharge: 2024-01-28 | Disposition: A | Discharge status: Home / Self Care | Attending: Emergency Medicine | Admitting: Emergency Medicine

## 2024-01-28 ENCOUNTER — Other Ambulatory Visit: Payer: Self-pay

## 2024-01-28 DIAGNOSIS — K029 Dental caries, unspecified: Secondary | ICD-10-CM | POA: Diagnosis not present

## 2024-01-28 DIAGNOSIS — K047 Periapical abscess without sinus: Secondary | ICD-10-CM | POA: Insufficient documentation

## 2024-01-28 DIAGNOSIS — K0889 Other specified disorders of teeth and supporting structures: Secondary | ICD-10-CM | POA: Diagnosis present

## 2024-01-28 MED ORDER — HYDROCODONE-ACETAMINOPHEN 5-325 MG PO TABS: 1.0000 | ORAL_TABLET | Freq: Three times a day (TID) | ORAL | 0 refills | Status: AC | PRN

## 2024-01-28 MED ORDER — AMOXICILLIN-POT CLAVULANATE 875-125 MG PO TABS: 1.0000 | ORAL_TABLET | Freq: Two times a day (BID) | ORAL | 0 refills | Status: AC

## 2024-01-28 MED ORDER — AMOXICILLIN-POT CLAVULANATE 875-125 MG PO TABS
1.0000 | ORAL_TABLET | Freq: Once | ORAL | Status: AC
  Administered 2024-01-28: 1 via ORAL
  Filled 2024-01-28: qty 1

## 2024-01-28 NOTE — ED Triage Notes (Signed)
 Patient reports left upper dental pain/swelling since Friday.

## 2024-01-28 NOTE — ED Provider Notes (Signed)
 [facility] Emergency Department Provider Note     Event Date/Time   First MD Initiated Contact with Patient 01/28/24 1206     (approximate)   History   Dental Pain   HPI  Genesys Coggeshall is a 43 y.o. female presents to the ED for evaluation of dental pain. She describes swelling to a upper tooth. She notes onset since Friday, 4 days prior. She denies FCS, NV, or difficulty breathing, swallowing, or managing secretions.     Physical Exam   Triage Vital Signs: ED Triage Vitals  Encounter Vitals Group     BP 01/28/24 1130 (!) 156/103     Systolic BP Percentile --      Diastolic BP Percentile --      Pulse Rate 01/28/24 1130 64     Resp 01/28/24 1130 18     Temp 01/28/24 1130 98.4 F (36.9 C)     Temp Source 01/28/24 1130 Oral     SpO2 01/28/24 1130 100 %     Weight 01/28/24 1128 235 lb (106.6 kg)     Height 01/28/24 1128 5\' 7"  (1.702 m)     Head Circumference --      Peak Flow --      Pain Score 01/28/24 1128 7     Pain Loc --      Pain Education --      Exclude from Growth Chart --     Most recent vital signs: Vitals:   01/28/24 1130  BP: (!) 156/103  Pulse: 64  Resp: 18  Temp: 98.4 F (36.9 C)  SpO2: 100%    General Awake, no distress. NAD HEENT NCAT. PERRL. EOMI. No rhinorrhea. Mucous membranes are moist.  Uvula is midline and tonsils are flat.  No oropharyngeal lesions appreciated.  Tender palpation to the left upper maxilla.  Second premolar and first premolar are absent in that region.  Subtle fullness to the left nasolabial fold noted.  No pointing, fluctuance, or spontaneous drainage is appreciated. CV:  Good peripheral perfusion.  RESP:  Normal effort.  ABD:  No distention.   ED Results / Procedures / Treatments   Labs (all labs ordered are listed, but only abnormal results are displayed) Labs Reviewed - No data to display   EKG   RADIOLOGY  No results found.   PROCEDURES:  Critical Care performed:  No  Procedures   MEDICATIONS ORDERED IN ED: Medications  amoxicillin-clavulanate (AUGMENTIN) 875-125 MG per tablet 1 tablet (has no administration in time range)     IMPRESSION / MDM / ASSESSMENT AND PLAN / ED COURSE  I reviewed the triage vital signs and the nursing notes.                              Differential diagnosis includes, but is not limited to, dental caries, dental abscess, gingivitis, dental trauma, Ludwig's angina, angioedema  Patient's presentation is most consistent with acute presentation with potential threat to life or bodily function.  Patient's diagnosis is consistent with acute dental pain secondary to dental infection.  Patient stable on the presentation at this time.  No evidence of any angioedema or airway obstruction.  Patient will be discharged home with prescriptions for Augmentin and hydrocodone (#9). Patient is to follow up with her dental provider for definitive management as discussed, as needed or otherwise directed. Patient is given ED precautions to return to the ED for any worsening or  new symptoms.  FINAL CLINICAL IMPRESSION(S) / ED DIAGNOSES   Final diagnoses:  Pain due to dental caries  Dental infection     Rx / DC Orders   ED Discharge Orders          Ordered    amoxicillin-clavulanate (AUGMENTIN) 875-125 MG tablet  2 times daily        01/28/24 1234    HYDROcodone-acetaminophen (NORCO/VICODIN) 5-325 MG tablet  3 times daily PRN        01/28/24 1234             Note:  This document was prepared using Dragon voice recognition software and may include unintentional dictation errors.    Lissa Hoard, PA-C 01/28/24 1238    Merwyn Katos, MD 01/28/24 [phone]

## 2024-01-28 NOTE — Discharge Instructions (Addendum)
 Take the antibiotic as directed and the pain medicine as needed.  Follow-up your dental provider as discussed.  OPTIONS FOR DENTAL FOLLOW UP CARE  [place] [facility] and [facility] Services - Local Safety Net Dental Clinics [email or web address]   [facility] [phone])  [facility] [phone])  [place] [phone] ext [phone])  [facility] [phone])  [facility] [phone]) This clinic caters to the indigent population and is on a lottery system. Location: [employer] [facility], [employer], [address], [place] Clinic Hours: Wednesdays from 6pm - 9pm, patients seen by a lottery system. For dates, call or go to [email or web address] Services: Cleanings, fillings and simple extractions. Payment Options: DENTAL WORK IS FREE OF CHARGE. Bring proof of income or support. Best way to get seen: Arrive at 5:15 pm - this is a lottery, NOT first come/first serve, so arriving earlier will not increase your chances of being seen.     [facility] [phone] Select option 1 for emergencies   Location: [facility], [place], [address] [place] Clinic Hours: No walk-ins accepted - call the day before to schedule an appointment. Check in times are 9:30 am and 1:30 pm. Services: Simple extractions, temporary fillings, pulpectomy/pulp debridement, uncomplicated abscess drainage. Payment Options: PAYMENT IS DUE AT THE TIME OF SERVICE.  Fee is usually $100-200, additional surgical procedures (e.g. abscess drainage) may be extra. Cash, checks, Visa/MasterCard accepted.  Can file Medicaid if patient is covered for dental - patient should call case worker to check. No discount for [facility] patients. Best way to get seen: MUST call the day before and get onto the schedule. Can  usually be seen the next 1-2 days. No walk-ins accepted.     [facility] [phone]   Location: [facility], [address] [place] Clinic Hours: M, W, Th, F 8am or 1:30pm, Tues 9a or 1:30 - first come/first served. Services: Simple extractions, temporary fillings, uncomplicated abscess drainage.  You do not need to be an [facility] resident. Payment Options: PAYMENT IS DUE AT THE TIME OF SERVICE. Dental insurance, otherwise sliding scale - bring proof of income or support. Depending on income and treatment needed, cost is usually $50-200. Best way to get seen: Arrive early as it is first come/first served.     [facility] [phone]   Location: [address] Clinic Hours: Mon-Thu 8a-5p Services: Most basic dental services including extractions and fillings. Payment Options: PAYMENT IS DUE AT THE TIME OF SERVICE. Sliding scale, up to 50% off - bring proof if income or support. Medicaid with dental option accepted. Best way to get seen: Call to schedule an appointment, can usually be seen within 2 weeks OR they will try to see walk-ins - show up at 8a or 2p (you may have to wait).     [facility] [phone] [place] RESIDENTS ONLY   Location: [facility], [address], [place], [place] [address] Clinic Hours: By appointment only. Monday - Thursday 8am-5pm, Friday 8am-12pm Services: Cleanings, fillings, extractions. Payment Options: PAYMENT IS DUE AT THE TIME OF SERVICE. Cash, Visa or MasterCard. Sliding scale - $30 minimum per service. Best way to get seen: Come in to office, complete packet and make an appointment - need proof of income or support monies for each household member and proof of [facility] residence. Usually takes about a month  to get in.     [facility] [phone]   Location: [address] [facility] Clinic Hours: Walk-in Urgent Care Dental Services are offered Monday-Friday mornings only. The numbers of emergencies accepted daily is limited to the number of providers available. Maximum 15 - Mondays, Wednesdays & Thursdays Maximum 10 - Tuesdays & Fridays Services: You do not need to be a [facility] resident to be seen for a dental emergency. Emergencies are defined as pain, swelling, abnormal bleeding, or dental trauma. Walkins will receive x-rays if needed. NOTE: Dental cleaning is not an emergency. Payment Options: PAYMENT IS DUE AT THE TIME OF SERVICE. Minimum co-pay is $40.00 for uninsured patients. Minimum co-pay is $3.00 for Medicaid with dental coverage. Dental Insurance is accepted and must be presented at time of visit. Medicare does not cover dental. Forms of payment: Cash, credit card, checks. Best way to get seen: If not previously registered with the clinic, walk-in dental registration begins at 7:15 am and is on a first come/first serve basis. If previously registered with the clinic, call to make an appointment.     [facility] [phone] [place] RESIDENTS ONLY   Location: [address], [place], [place] Clinic Hours: Mon-Thu 10a-2p Services: Extractions only! Payment Options: FREE (donations accepted) - bring proof of income or support Best way to get seen: Call and schedule an appointment OR come at 8am on the 1st Monday of every month (except for holidays) when it is first come/first served.     [facility] [phone]   Location: [address] [place], [place] Clinic Hours: Friday mornings Services, Payment Options, Best way to get seen: Call for info

## 2024-01-28 NOTE — ED Notes (Signed)
 Pt verbalizes understanding of discharge instructions. Opportunity for questioning and answers were provided. Pt discharged from ED to home by self.

## 2024-04-07 ENCOUNTER — Other Ambulatory Visit: Payer: Self-pay

## 2024-04-07 ENCOUNTER — Emergency Department
Admission: EM | Admit: 2024-04-07 | Discharge: 2024-04-07 | Disposition: A | Discharge status: Home / Self Care | Attending: Emergency Medicine | Admitting: Emergency Medicine

## 2024-04-07 DIAGNOSIS — R202 Paresthesia of skin: Secondary | ICD-10-CM | POA: Diagnosis present

## 2024-04-07 DIAGNOSIS — T7840XA Allergy, unspecified, initial encounter: Secondary | ICD-10-CM | POA: Insufficient documentation

## 2024-04-07 MED ORDER — DIPHENHYDRAMINE HCL 25 MG PO CAPS
25.0000 mg | ORAL_CAPSULE | Freq: Once | ORAL | Status: AC
  Administered 2024-04-07: 25 mg via ORAL
  Filled 2024-04-07: qty 1

## 2024-04-07 MED ORDER — FAMOTIDINE 20 MG PO TABS
20.0000 mg | ORAL_TABLET | Freq: Once | ORAL | Status: AC
  Administered 2024-04-07: 20 mg via ORAL
  Filled 2024-04-07: qty 1

## 2024-04-07 NOTE — ED Provider Notes (Signed)
 [facility] Provider Note    Event Date/Time   First MD Initiated Contact with Patient 04/07/24 1125     (approximate)   History   Allergic Reaction   HPI  Mary Hicks is a 43 y.o. female who presents today for evaluation of possible allergic reaction.  Patient reports that this has been happening intermittently for several years and she is not sure what she is allergic to.  She reports that she began to have tingling in her hands, and then also periorally.  No tongue swelling.  No difficulty breathing or swallowing.  She has not tried anything at home.  She denies any new exposures.   Physical Exam   Triage Vital Signs: ED Triage Vitals [04/07/24 1055]  Encounter Vitals Group     BP (!) 160/98     Systolic BP Percentile      Diastolic BP Percentile      Pulse Rate 85     Resp 16     Temp 98.4 F (36.9 C)     Temp Source Oral     SpO2 99 %     Weight 240 lb (108.9 kg)     Height 5\' 7"  (1.702 m)     Head Circumference      Peak Flow      Pain Score 8     Pain Loc      Pain Education      Exclude from Growth Chart     Most recent vital signs: Vitals:   04/07/24 1055  BP: (!) 160/98  Pulse: 85  Resp: 16  Temp: 98.4 F (36.9 C)  SpO2: 99%    Physical Exam Vitals and nursing note reviewed.  Constitutional:      General: Awake and alert. No acute distress.    Appearance: Normal appearance. The patient is normal weight.  HENT:     Head: Normocephalic and atraumatic.     Mouth: Mucous membranes are moist.  No periorbital abnormalities.  No intraoral swelling.  No tongue swelling.  No drooling. Eyes:     General: PERRL. Normal EOMs        Right eye: No discharge.        Left eye: No discharge.     Conjunctiva/sclera: Conjunctivae normal.  Cardiovascular:     Rate and Rhythm: Normal rate and regular rhythm.     Pulses: Normal pulses.  Pulmonary:     Effort: Pulmonary effort is normal. No respiratory distress.     Breath  sounds: Normal breath sounds.  Abdominal:     Abdomen is soft. There is no abdominal tenderness. No rebound or guarding. No distention. Musculoskeletal:        General: No swelling. Normal range of motion.     Cervical back: Normal range of motion and neck supple.  Skin:    General: Skin is warm and dry.     Capillary Refill: Capillary refill takes less than 2 seconds.     Findings: Mild erythema noted to left thenar eminence, otherwise no urticaria or skin changes noted. Neurological:     Mental Status: The patient is awake and alert.      ED Results / Procedures / Treatments   Labs (all labs ordered are listed, but only abnormal results are displayed) Labs Reviewed - No data to display   EKG     RADIOLOGY     PROCEDURES:  Critical Care performed:   Procedures   MEDICATIONS ORDERED IN ED:  Medications  diphenhydrAMINE (BENADRYL) capsule 25 mg (25 mg Oral Given 04/07/24 1141)  famotidine (PEPCID) tablet 20 mg (20 mg Oral Given 04/07/24 1142)     IMPRESSION / MDM / ASSESSMENT AND PLAN / ED COURSE  I reviewed the triage vital signs and the nursing notes.   Differential diagnosis includes, but is not limited to, allergic reaction, contact dermatitis, urticaria.  Patient is awake alert, hemodynamically stable and afebrile.  She has normal oxygen saturation of 99% on room air.  She demonstrates no increased work of breathing.  There are no obvious abnormalities on exam, no urticaria, no intraoral or swelling, no wheezing, I do not suspect anaphylaxis.  She reports that this has been happening intermittently for many years, which is suspicious for mild allergic reaction versus contact dermatitis.  She was given antihistamines.  She was advised that she can continue to take antihistamines over-the-counter as needed.  She was advised that Benadryl will make her sleepy, as well as some over-the-counter antihistamines and if she has a drowsy antihistamine that she cannot drive,  operate machinery, or perform any tasks that require concentration.  I recommended that she follow-up with an allergist to see what she is potentially allergic to.  We also discussed return precautions.  Patient understands and agrees with plan.  She was discharged in stable condition.   Patient's presentation is most consistent with acute illness / injury with system symptoms.   FINAL CLINICAL IMPRESSION(S) / ED DIAGNOSES   Final diagnoses:  Allergic reaction, initial encounter     Rx / DC Orders   ED Discharge Orders     None        Note:  This document was prepared using Dragon voice recognition software and may include unintentional dictation errors.   Antoinett Dorman E, PA-C 04/07/24 1233    Ruth Cove, MD 04/07/24 1445

## 2024-04-07 NOTE — ED Triage Notes (Signed)
 Pt c/o itching and burning to her L hand and mouth. No obvious rash or sores to skin or mucous membranes. Pt denies coming in contact with any known allergens. Respirations even and unlabored.

## 2024-04-07 NOTE — Discharge Instructions (Signed)
 You may continue to take Benadryl and Pepcid per package instructions to help with your symptoms.  Remember that Benadryl will make you sleepy and you should not drive, operate heavy machinery, or perform tasks require concentration while taking this medication.  You should also follow-up with an allergist or ENT as we discussed for allergy testing.  Please return for any new, worsening, or change in symptoms or other concerns.  It was a pleasure caring for you today.

## 2024-05-05 ENCOUNTER — Emergency Department

## 2024-05-05 ENCOUNTER — Other Ambulatory Visit: Payer: Self-pay

## 2024-05-05 ENCOUNTER — Emergency Department
Admission: EM | Admit: 2024-05-05 | Discharge: 2024-05-06 | Discharge status: Against Medical Advice | Attending: Emergency Medicine | Admitting: Emergency Medicine

## 2024-05-05 DIAGNOSIS — R1010 Upper abdominal pain, unspecified: Secondary | ICD-10-CM | POA: Insufficient documentation

## 2024-05-05 DIAGNOSIS — I10 Essential (primary) hypertension: Secondary | ICD-10-CM | POA: Diagnosis not present

## 2024-05-05 DIAGNOSIS — R059 Cough, unspecified: Secondary | ICD-10-CM | POA: Insufficient documentation

## 2024-05-05 DIAGNOSIS — J111 Influenza due to unidentified influenza virus with other respiratory manifestations: Secondary | ICD-10-CM

## 2024-05-05 DIAGNOSIS — R079 Chest pain, unspecified: Secondary | ICD-10-CM | POA: Insufficient documentation

## 2024-05-05 DIAGNOSIS — J45909 Unspecified asthma, uncomplicated: Secondary | ICD-10-CM | POA: Diagnosis not present

## 2024-05-05 DIAGNOSIS — R109 Unspecified abdominal pain: Secondary | ICD-10-CM | POA: Diagnosis present

## 2024-05-05 LAB — COMPREHENSIVE METABOLIC PANEL WITH GFR
ALT: 19 U/L (ref 0–44)
AST: 26 U/L (ref 15–41)
Albumin: 3.9 g/dL (ref 3.5–5.0)
Alkaline Phosphatase: 53 U/L (ref 38–126)
Anion gap: 12 (ref 5–15)
BUN: 17 mg/dL (ref 6–20)
CO2: 22 mmol/L (ref 22–32)
Calcium: 9 mg/dL (ref 8.9–10.3)
Chloride: 104 mmol/L (ref 98–111)
Creatinine, Ser: 0.82 mg/dL (ref 0.44–1.00)
GFR, Estimated: >60 mL/min (ref >60)
Glucose, Bld: 98 mg/dL (ref 70–99)
Potassium: 3.8 mmol/L (ref 3.5–5.1)
Sodium: 138 mmol/L (ref 135–145)
Total Bilirubin: 1.2 mg/dL (ref 0.0–1.2)
Total Protein: 7.6 g/dL (ref 6.5–8.1)

## 2024-05-05 LAB — CBC WITH DIFFERENTIAL/PLATELET
Abs Immature Granulocytes: 0.02 10*3/uL (ref 0.00–0.07)
Basophils Absolute: 0 10*3/uL (ref 0.0–0.1)
Basophils Relative: 1 %
Eosinophils Absolute: 0.1 10*3/uL (ref 0.0–0.5)
Eosinophils Relative: 2 %
HCT: 37.3 % (ref 36.0–46.0)
Hemoglobin: 12.4 g/dL (ref 12.0–15.0)
Immature Granulocytes: 0 %
Lymphocytes Relative: 45 %
Lymphs Abs: 2.5 10*3/uL (ref 0.7–4.0)
MCH: 31 pg (ref 26.0–34.0)
MCHC: 33.2 g/dL (ref 30.0–36.0)
MCV: 93.3 fL (ref 80.0–100.0)
Monocytes Absolute: 0.4 10*3/uL (ref 0.1–1.0)
Monocytes Relative: 7 %
Neutro Abs: 2.4 10*3/uL (ref 1.7–7.7)
Neutrophils Relative %: 45 %
Platelets: 224 10*3/uL (ref 150–400)
RBC: 4 MIL/uL (ref 3.87–5.11)
RDW: 12.3 % (ref 11.5–15.5)
WBC: 5.4 10*3/uL (ref 4.0–10.5)
nRBC: 0 % (ref 0.0–0.2)

## 2024-05-05 LAB — RESP PANEL BY RT-PCR (RSV, FLU A&B, COVID)  RVPGX2
Influenza A by PCR: NEGATIVE
Influenza B by PCR: NEGATIVE
Resp Syncytial Virus by PCR: NEGATIVE
SARS Coronavirus 2 by RT PCR: NEGATIVE

## 2024-05-05 LAB — URINALYSIS, ROUTINE W REFLEX MICROSCOPIC
Bilirubin Urine: NEGATIVE
Glucose, UA: NEGATIVE mg/dL
Hgb urine dipstick: NEGATIVE
Ketones, ur: NEGATIVE mg/dL
Leukocytes,Ua: NEGATIVE
Nitrite: NEGATIVE
Protein, ur: NEGATIVE mg/dL
Specific Gravity, Urine: 1.002 — ABNORMAL LOW (ref 1.005–1.030)
pH: 6 (ref 5.0–8.0)

## 2024-05-05 MED ORDER — IBUPROFEN 800 MG PO TABS
800.0000 mg | ORAL_TABLET | Freq: Once | ORAL | Status: AC
  Administered 2024-05-05: 800 mg via ORAL
  Filled 2024-05-05: qty 1

## 2024-05-05 NOTE — ED Triage Notes (Signed)
 Pt reports she has had cough congestion and generalized body aches for the past 4 days

## 2024-05-05 NOTE — ED Provider Notes (Signed)
 [facility] Provider Note    Event Date/Time   First MD Initiated Contact with Patient 05/05/24 2054     (approximate)   History   Cough    HPI  Mary Hicks is a 43 y.o. female    with a past medical history of dental pain, left thyroid  nodule, patellofemoral syndrome, OAB, GERD, who presents to the ED complaining of cough. According to the patient, symptoms started 4 days ago with dry cough, body aches, abdominal pain, chest pain.  Patient denies vomit, diarrhea, urinary symptoms.  Patient denies sick contacts at home or work.     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) 09/25/2017   BMI 40.0-44.9, adult (HCC) 08/01/2017   GAD (generalized anxiety disorder) 05/09/2015   HTN, goal below 140/80 05/09/2015   History of narcotic use 11/02/2013   Difficulty voiding 03/09/2013   Lower urinary tract symptoms (LUTS) 03/09/2013   Asthma 09/03/2012   Depression 09/03/2012   Dyspareunia 09/03/2012   Intraoperative ureteral injury 07/14/2012     ROS: Patient currently denies any vision changes, tinnitus, difficulty speaking, facial droop, sore throat, chest pain, shortness of breath, abdominal pain, nausea/vomiting/diarrhea, dysuria, or weakness/numbness/paresthesias in any extremity   Physical Exam   Triage Vital Signs: ED Triage Vitals  Encounter Vitals Group     BP 05/05/24 2043 (!) 125/92     Girls Systolic BP Percentile --      Girls Diastolic BP Percentile --      Boys Systolic BP Percentile --      Boys Diastolic BP Percentile --      Pulse Rate 05/05/24 2043 95     Resp 05/05/24 2043 16     Temp 05/05/24 2043 98.3 F (36.8 C)     Temp src --      SpO2 05/05/24 2043 100 %     Weight 05/05/24 2042 242 lb  (109.8 kg)     Height 05/05/24 2042 5' 7 (1.702 m)     Head Circumference --      Peak Flow --      Pain Score 05/05/24 2042 10     Pain Loc --      Pain Education --      Exclude from Growth Chart --     Most recent vital signs: Vitals:   05/05/24 2043  BP: (!) 125/92  Pulse: 95  Resp: 16  Temp: 98.3 F (36.8 C)  SpO2: 100%     Physical Exam Vitals and nursing note reviewed.  Patient was hypertensive during triage Constitutional:      General: Awake and alert. No acute distress.    Appearance: Normal appearance. The patient is normal weight.      Able to speak in complete sentences without cough or dyspnea  HENT:     Head: Normocephalic and atraumatic.     Mouth: Mucous membranes are moist.  No peritonsillar erythema, no tonsillar hypertrophy Ears: Left ear:  Peritympanic erythema, no otorrhea.  Eyes:     General: PERRLA. Normal EOMs        Right eye: No discharge.        Left eye: No discharge.     Conjunctiva/sclera: Conjunctivae normal.  Nose No congestion/rhinnorhea. Neck Painless  ROM. Supple. No JVD, nodes, thyromegaly  Cardiovascular:     Rate and Rhythm: Normal rate and regular rhythm. No murmurs, gallops, rubs     Pulses: Normal pulses.  Pulmonary:     Effort: Pulmonary effort is normal. No respiratory distress.     Breath sounds: Normal breath sounds.  Abdominal:  Skin is intact, no ecchymosis or hematomas, bowel sounds positive.  abdomen is soft, tenderness to deep palpation in the right lower quadrant, Rovsing positive, rebound positive Musculoskeletal:        General: No swelling. Normal range of motion.  Skin:    General: Skin is warm and dry.     Capillary Refill: Capillary refill takes less than 2 seconds.     Findings: No rash.  Neurological:     Mental Status: The patient is awake and alert. MAE spontaneously. No gross focal neurologic deficits are appreciated.  Psychiatric Mood and affect are normal. Speech and behavior are normal.  ED  Results / Procedures / Treatments   Labs (all labs ordered are listed, but only abnormal results are displayed) Labs Reviewed  URINALYSIS, ROUTINE W REFLEX MICROSCOPIC - Abnormal; Notable for the following components:      Result Value   Color, Urine COLORLESS (*)    APPearance HAZY (*)    Specific Gravity, Urine 1.002 (*)    All other components within normal limits  RESP PANEL BY RT-PCR (RSV, FLU A&B, COVID)  [id number]  COMPREHENSIVE METABOLIC PANEL WITH GFR  CBC WITH DIFFERENTIAL/PLATELET  POC URINE PREG, ED     EKG See physician read    RADIOLOGY I independently reviewed and interpreted imaging and agree with radiologists findings.      PROCEDURES:  Critical Care performed:   Procedures   MEDICATIONS ORDERED IN ED: Medications  ibuprofen (ADVIL) tablet 800 mg (800 mg Oral Given 05/05/24 2209)   Clinical Course as of 05/05/24 2247  Tue May 05, 2024  2117 DG Chest 2 View  No acute process. [AE]  2152 Resp panel by RT-PCR (RSV, Flu A&B, Covid) Anterior Nasal Swab Negative [AE]  2152 White blood cells, hemoglobin, platelets and differential within normal limits [AE]  2215 Electrolytes, renal function, liver function within normal limits [AE]  2215 Urinalysis, Routine w reflex microscopic -Urine, Catheterized(!) Negative [AE]  2244 I went to the patient's room to update about labs patient was not in the room, she left with the IV. [AE]    Clinical Course User Index [AE] Mary Lennox, PA-C    IMPRESSION / MDM / ASSESSMENT AND PLAN / ED COURSE  I reviewed the triage vital signs and the nursing notes.  Differential diagnosis includes, but is not limited to, upper respiratory viral infection, appendicitis, MI, otitis.  Patient's presentation is most consistent with acute complicated illness / injury requiring diagnostic workup.   Mary Hicks is a 43 y.o., female who presents today with history of 4 days of bodyaches, cough, abdominal pain without  diarrhea nauseous or vomiting physical exam . Patient's diagnosis is consistent with abdominal pain, influenza-like illness. I independently reviewed and interpreted imaging and agree with radiologists findings. Labs are  reassuring. I did review the patient's allergies and medications.The patient is in stable and satisfactory condition for discharge home.  I went to update patient about results of labs the patient was not in the room she left with IV on her arm  FINAL CLINICAL IMPRESSION(S) / ED DIAGNOSES   Final diagnoses:  Influenza-like illness  Pain of upper abdomen  Rx / DC Orders   ED Discharge Orders     None        Note:  This document was prepared using Dragon voice recognition software and may include unintentional dictation errors.   Mary Lennox, PA-C 05/05/24 2247    Twilla Galea, MD 05/05/24 2312

## 2024-05-05 NOTE — ED Notes (Addendum)
 Pt reports she has been having generalized pain, chest pain, abd pain, N/V and SHOB that has been getting progressively worse for the past 4 days.  Pt is able to speak in complete sentences at this time.  Pt denies any sick contacts.

## 2024-05-05 NOTE — ED Notes (Signed)
 Pt found to have left the facility with IV in place. Pt contacted and instructed to return to ensure IV removal. Pt agrees and sts she is on her way back to ED at this time

## 2024-05-05 NOTE — ED Notes (Signed)
 Pt was called again and informed she needed to come back to the hospital. In less than an hour.  Pt  stated okay I am on my way back, I am on my way back

## 2024-05-06 NOTE — ED Notes (Signed)
 Pt returned to the ED and the IV was removed.   The pt asked for discharge paper and she was informed that due to leaving prior to treatment being competed we do not have paper work to provider her.

## 2024-07-08 ENCOUNTER — Other Ambulatory Visit: Payer: Self-pay | Admitting: *Deleted

## 2024-07-08 ENCOUNTER — Inpatient Hospital Stay
Admission: RE | Admit: 2024-07-08 | Discharge: 2024-07-08 | Disposition: A | Discharge status: Home / Self Care | Payer: Self-pay | Source: Ambulatory Visit | Attending: Physician Assistant | Admitting: Physician Assistant

## 2024-07-08 DIAGNOSIS — Z1231 Encounter for screening mammogram for malignant neoplasm of breast: Secondary | ICD-10-CM

## 2024-07-28 ENCOUNTER — Ambulatory Visit
Admission: RE | Admit: 2024-07-28 | Discharge: 2024-07-28 | Disposition: A | Discharge status: Home / Self Care | Source: Ambulatory Visit | Attending: Physician Assistant | Admitting: Physician Assistant

## 2024-07-28 DIAGNOSIS — Z1231 Encounter for screening mammogram for malignant neoplasm of breast: Secondary | ICD-10-CM | POA: Diagnosis present

## 2024-08-25 ENCOUNTER — Other Ambulatory Visit: Payer: Self-pay | Admitting: Otolaryngology

## 2024-08-29 ENCOUNTER — Emergency Department
Admission: EM | Admit: 2024-08-29 | Discharge: 2024-08-29 | Disposition: A | Discharge status: Home / Self Care | Attending: Emergency Medicine | Admitting: Emergency Medicine

## 2024-08-29 ENCOUNTER — Encounter: Payer: Self-pay | Admitting: Emergency Medicine

## 2024-08-29 ENCOUNTER — Other Ambulatory Visit: Payer: Self-pay

## 2024-08-29 DIAGNOSIS — M546 Pain in thoracic spine: Secondary | ICD-10-CM | POA: Insufficient documentation

## 2024-08-29 LAB — WET PREP, GENITAL
Sperm: NONE SEEN
Trich, Wet Prep: NONE SEEN
WBC, Wet Prep HPF POC: <10 (ref <10)
Yeast Wet Prep HPF POC: NONE SEEN

## 2024-08-29 LAB — CHLAMYDIA/NGC RT PCR (ARMC ONLY)
Chlamydia Tr: NOT DETECTED
N gonorrhoeae: NOT DETECTED

## 2024-08-29 MED ORDER — KETOROLAC TROMETHAMINE 10 MG PO TABS: 10.0000 mg | ORAL_TABLET | Freq: Three times a day (TID) | ORAL | 0 refills | Status: DC | PRN

## 2024-08-29 MED ORDER — LIDOCAINE 5 % EX PTCH: 1.0000 | MEDICATED_PATCH | CUTANEOUS | 0 refills | Status: DC

## 2024-08-29 MED ORDER — METRONIDAZOLE 500 MG PO TABS: 500.0000 mg | ORAL_TABLET | Freq: Two times a day (BID) | ORAL | 0 refills | Status: DC

## 2024-08-29 MED ORDER — PREDNISONE 10 MG (21) PO TBPK: ORAL_TABLET | ORAL | 0 refills | Status: DC

## 2024-08-29 MED ORDER — KETOROLAC TROMETHAMINE 15 MG/ML IJ SOLN
30.0000 mg | Freq: Once | INTRAMUSCULAR | Status: AC
Start: 2024-08-29 — End: 2024-08-29
  Administered 2024-08-29: 30 mg via INTRAMUSCULAR
  Filled 2024-08-29: qty 2

## 2024-08-29 MED ORDER — LIDOCAINE 5 % EX PTCH
1.0000 | MEDICATED_PATCH | CUTANEOUS | Status: DC
  Administered 2024-08-29: 1 via TRANSDERMAL
  Filled 2024-08-29: qty 1

## 2024-08-29 NOTE — ED Notes (Signed)
 Pt also asking for STI testing.

## 2024-08-29 NOTE — ED Triage Notes (Signed)
 Pt via POV from home. Pt c/o upper back pain near her shoulder blades. Reports its been going on the past couple of months but today its the worse its been. Worse with movement. Pt is A&Ox4 and NAD, ambulatory to triage.

## 2024-08-29 NOTE — ED Provider Notes (Signed)
 [facility] Provider Note    Event Date/Time   First MD Initiated Contact with Patient 08/29/24 1328     (approximate)   History   Back Pain   HPI  Mary Hicks is a 43 y.o. female  who presents to the emergency department today because of concern for left upper back pain. Has been waxing and waning for months. 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 This morning when she woke up the pain was more severe than it has been. She has chronic sob due to asthma but it does not feel it is any worse than normal. She has secondary complaint of abnormal vaginal discharge.    Physical Exam   Triage Vital Signs: ED Triage Vitals  Encounter Vitals Group     BP 08/29/24 1306 (!) 152/101     Girls Systolic BP Percentile --      Girls Diastolic BP Percentile --      Boys Systolic BP Percentile --      Boys Diastolic BP Percentile --      Pulse Rate 08/29/24 1306 72     Resp 08/29/24 1306 18     Temp 08/29/24 1306 99.1 F (37.3 C)     Temp Source 08/29/24 1306 Oral     SpO2 08/29/24 1306 100 %     Weight 08/29/24 1305 238 lb (108 kg)     Height 08/29/24 1305 5' 7.5 (1.715 m)     Head Circumference --      Peak Flow --      Pain Score 08/29/24 1305 10     Pain Loc --      Pain Education --      Exclude from Growth Chart --     Most recent vital signs: Vitals:   08/29/24 1306  BP: (!) 152/101  Pulse: 72  Resp: 18  Temp: 99.1 F (37.3 C)  SpO2: 100%   General: Awake, alert, oriented. CV:  Good peripheral perfusion.  Resp:  Normal effort.  Abd:  No distention.  Other:  Left upper back with tenderness over the scapula and trapezious. No skin changes. No spinal tenderness. Left upper extremity warm, radial pulse 2+.    ED Results / Procedures / Treatments   Labs (all labs ordered are listed, but only abnormal results are displayed) Labs Reviewed  WET PREP, GENITAL -  Abnormal; Notable for the following components:      Result Value   Clue Cells Wet Prep HPF POC PRESENT (*)    All other components within normal limits  CHLAMYDIA/NGC RT PCR ([facility] ONLY)            MISC [facility] TEST (SEND OUT)     EKG  None   RADIOLOGY None   PROCEDURES:  Critical Care performed: No    MEDICATIONS ORDERED IN ED: Medications - No data to display   IMPRESSION / MDM / ASSESSMENT AND PLAN / ED COURSE  I reviewed the triage vital signs and the nursing notes.                              Differential diagnosis includes, but is not limited to, radiculopathy, muscle strain, shingles, pneumonia, PE  Patient's presentation is most consistent with acute presentation with potential threat to life or bodily function.  Patient presented to the emergency department today because  of concerns for left upper back pain.  The pain has been coming and going for a couple of months.  She does state that she does a lot of work with her left arm.  On exam patient has some tenderness over the scapula and left trapezius.  No midline tenderness.  No concerning abnormality of the left upper extremity.  Patient denies any acute shortness of breath, patient is not tachypneic or hypoxic.  I would have a low concern for pneumonia PE or other pulmonary etiology.  Given the fact she is tender I do think it is likely MSK in origin.  I did discuss this with the patient.  Will plan on giving patient Lidoderm , steroids for inflammation and pain medication.  Patient requested narcotics, in particular oxycodone.  Given somewhat chronic nature of the patient's pain I do think it would be reasonable to trial alternative treatments before narcotics.  I did discuss this with the patient.  She did state I would hate to have to sue the hospital because she did not get the medication she said worked for her.  In addition the patient had complained of some vaginal discharge.  Wet prep is positive for clue  cells.  Will give patient prescription for Flagyl .     FINAL CLINICAL IMPRESSION(S) / ED DIAGNOSES   Final diagnoses:  Left-sided thoracic back pain, unspecified chronicity      Note:  This document was prepared using Dragon voice recognition software and may include unintentional dictation errors.    Floy Roberts, MD 08/29/24 [phone]

## 2024-09-01 LAB — MISC LABCORP TEST (SEND OUT): Labcorp test code: 83935

## 2024-09-05 ENCOUNTER — Emergency Department
Admission: EM | Admit: 2024-09-05 | Discharge: 2024-09-05 | Disposition: A | Discharge status: Home / Self Care | Attending: Emergency Medicine | Admitting: Emergency Medicine

## 2024-09-05 ENCOUNTER — Other Ambulatory Visit: Payer: Self-pay

## 2024-09-05 DIAGNOSIS — M792 Neuralgia and neuritis, unspecified: Secondary | ICD-10-CM | POA: Diagnosis present

## 2024-09-05 DIAGNOSIS — J45909 Unspecified asthma, uncomplicated: Secondary | ICD-10-CM | POA: Diagnosis not present

## 2024-09-05 DIAGNOSIS — I10 Essential (primary) hypertension: Secondary | ICD-10-CM | POA: Diagnosis not present

## 2024-09-05 MED ORDER — LIDOCAINE 5 % EX PTCH: 1.0000 | MEDICATED_PATCH | CUTANEOUS | 0 refills | Status: AC

## 2024-09-05 MED ORDER — GABAPENTIN 300 MG PO CAPS: 300.0000 mg | ORAL_CAPSULE | Freq: Every day | ORAL | 0 refills | Status: DC

## 2024-09-05 MED ORDER — OXYCODONE-ACETAMINOPHEN 5-325 MG PO TABS: 1.0000 | ORAL_TABLET | Freq: Three times a day (TID) | ORAL | 0 refills | Status: AC | PRN

## 2024-09-05 MED ORDER — LIDOCAINE 5 % EX PTCH
1.0000 | MEDICATED_PATCH | CUTANEOUS | Status: DC
  Administered 2024-09-05: 1 via TRANSDERMAL
  Filled 2024-09-05: qty 1

## 2024-09-05 NOTE — Discharge Instructions (Signed)
 You have been diagnosed with neuropathic pain.  Please take gabapentin 1 capsule at bedtime.  You can take oxycodone 1 tablet by mouth every 8 hours for pain.  Please apply 1 patch under the skin every 12 hours.  Please make an appointment with your PCP for a follow-up.  Please come back to ED or go to your PCP if you have new symptoms symptoms worsen.

## 2024-09-05 NOTE — ED Provider Notes (Signed)
 [facility] Provider Note    Event Date/Time   First MD Initiated Contact with Patient 09/05/24 1751     (approximate)   History   Back Pain    HPI  Mary Hicks is a 43 y.o. female    with a past medical history of left side thoracic back pain, asthma, chronic pain of both knees, multinodular goiter, contusion of left lower leg bilateral lower extremity edema, hypertension sialoadenitis, stiffness of left knee, overactive bladder, de Quervain's tendinitis, gonorrhea, bilateral wrist pain, who presents to the ED complaining of back pain. According to the patient, pain started a few months ago, she was seen here a few weeks ago she was diagnosed with muscle strain and discharged with Flexeril .  Patient endorses no relief of the symptoms.  Patient describes the pain as a burning sensation, sharp, but increased with movement.  Patient denies fever.  Patient had chickenpox in her childhood.     Patient Active Problem List   Diagnosis Date Noted   OAB (overactive bladder) 06/01/2020   Esophageal dysphagia    Other specified disease of esophagus    Strain of knee 03/01/2020   Tendonitis of wrist, left 06/25/2018   Everitt Quervain's disease (radial styloid tenosynovitis) 09/25/2017   BMI 40.0-44.9, adult (HCC) 08/01/2017   GAD (generalized anxiety disorder) 05/09/2015   HTN, goal below 140/80 05/09/2015   History of narcotic use 11/02/2013   Difficulty voiding 03/09/2013   Lower urinary tract symptoms (LUTS) 03/09/2013   Asthma 09/03/2012   Depression 09/03/2012   Dyspareunia 09/03/2012   Intraoperative ureteral injury 07/14/2012     ROS: Patient currently denies any vision changes, tinnitus, difficulty speaking, facial droop, sore throat, chest pain, shortness of breath, abdominal pain, nausea/vomiting/diarrhea, dysuria, or weakness/numbness/paresthesias in any extremity   Physical Exam   Triage Vital Signs: ED Triage Vitals  Encounter Vitals Group      BP 09/05/24 1740 (!) 145/95     Girls Systolic BP Percentile --      Girls Diastolic BP Percentile --      Boys Systolic BP Percentile --      Boys Diastolic BP Percentile --      Pulse Rate 09/05/24 1740 65     Resp 09/05/24 1740 19     Temp 09/05/24 1740 98.8 F (37.1 C)     Temp Source 09/05/24 1740 Oral     SpO2 09/05/24 1740 100 %     Weight 09/05/24 1741 238 lb (108 kg)     Height 09/05/24 1741 5' 7.5 (1.715 m)     Head Circumference --      Peak Flow --      Pain Score 09/05/24 1740 10     Pain Loc --      Pain Education --      Exclude from Growth Chart --     Most recent vital signs: Vitals:   09/05/24 1740  BP: (!) 145/95  Pulse: 65  Resp: 19  Temp: 98.8 F (37.1 C)  SpO2: 100%     Physical Exam Vitals and nursing note reviewed.   General:          Awake, no distress.  CV:                  Good peripheral perfusion.  Resp:               Normal effort. no tachypnea Abd:  No distention.  Soft nontender Other:              Left thoracic back: Skin is intact, no presence of vesicles.  Very tender to superficial palpation at the level of the T3-T4.  Tenderness with flexion and extension of the left arm.  ED Results / Procedures / Treatments   Labs (all labs ordered are listed, but only abnormal results are displayed) Labs Reviewed - No data to display     PROCEDURES:  Critical Care performed:   Procedures   MEDICATIONS ORDERED IN ED: Medications - No data to display    IMPRESSION / MDM / ASSESSMENT AND PLAN / ED COURSE  I reviewed the triage vital signs and the nursing notes.  Differential diagnosis includes, but is not limited to, neuropathic pain, muscle strain, unlikely shingles  Patient's presentation is most consistent with acute, uncomplicated illness.   Mary Hicks is a 43 y.o., female who presents today with history of left thoracic back pain.  No history of recent trauma.  On a physical exam skin is intact no  presence of vesicles.  Tender to superficial palpation at the level of T3 of T4 dermatomes.  Tenderness to flexion and extension of the arm and shoulder.  Plan Lidocaine  patch Gabapentin Patient's diagnosis is consistent with neuropathic pain. I did not order any imaging or labs, physical exam is reassuring. I did review the patient's allergies and medications.The patient is in stable and satisfactory condition for discharge home  Patient will be discharged home with prescriptions for lidocaine  patch, gabapentin, oxycodone.  When prescribing oxycodone for 2 days the system alert with allergic reaction, patient denies allergic reaction to oxycodone .Patient is to follow up with PCP as needed or otherwise directed. Patient is given ED precautions to return to the ED for any worsening or new symptoms. Discussed plan of care with patient, answered all of patient's questions, Patient agreeable to plan of care. Advised patient to take medications according to the instructions on the label. Discussed possible side effects of new medications. Patient verbalized understanding.  FINAL CLINICAL IMPRESSION(S) / ED DIAGNOSES   Final diagnoses:  Neuropathic pain     Rx / DC Orders   ED Discharge Orders          Ordered    gabapentin (NEURONTIN) 300 MG capsule  Daily at bedtime        09/05/24 1921    lidocaine  (LIDODERM ) 5 %  Every 24 hours        09/05/24 1921    oxyCODONE-acetaminophen  (PERCOCET) 5-325 MG tablet  Every 8 hours PRN        09/05/24 1921             Note:  This document was prepared using Dragon voice recognition software and may include unintentional dictation errors.   Janit Kast, PA-C 09/05/24 ARTEMUS Levander Slate, MD 09/06/24 [phone]

## 2024-09-05 NOTE — ED Triage Notes (Signed)
 Patient c/o injury at work over 1 week ago; c/o back pain post injury. Reports was given some meds post injury by this ED; patient denies relief of symptoms w/med prescribed.

## 2024-10-01 ENCOUNTER — Encounter
Admission: RE | Admit: 2024-10-01 | Discharge: 2024-10-01 | Disposition: A | Discharge status: Home / Self Care | Source: Ambulatory Visit | Attending: Otolaryngology | Admitting: Otolaryngology

## 2024-10-01 ENCOUNTER — Other Ambulatory Visit: Payer: Self-pay

## 2024-10-01 VITALS — Wt 244.0 lb

## 2024-10-01 DIAGNOSIS — D649 Anemia, unspecified: Secondary | ICD-10-CM

## 2024-10-01 DIAGNOSIS — Z01812 Encounter for preprocedural laboratory examination: Secondary | ICD-10-CM

## 2024-10-01 DIAGNOSIS — E041 Nontoxic single thyroid nodule: Secondary | ICD-10-CM

## 2024-10-01 DIAGNOSIS — I10 Essential (primary) hypertension: Secondary | ICD-10-CM

## 2024-10-01 HISTORY — DX: Nontoxic single thyroid nodule: E04.1

## 2024-10-01 HISTORY — DX: Overactive bladder: N32.81

## 2024-10-01 HISTORY — DX: Anxiety disorder, unspecified: F41.9

## 2024-10-01 HISTORY — DX: Anemia, unspecified: D64.9

## 2024-10-01 HISTORY — DX: Nontoxic multinodular goiter: E04.2

## 2024-10-01 NOTE — Patient Instructions (Signed)
 Your procedure is scheduled on:10-13-24 Tuesday Report to the Registration Desk on the 1st floor of the [facility].Then proceed to the 2nd floor Surgery Desk To find out your arrival time, please call [phone] between 1PM - 3PM on:10-12-24 Monday If your arrival time is 6:00 am, do not arrive before that time as the [facility] entrance doors do not open until 6:00 am.  REMEMBER: Instructions that are not followed completely may result in serious medical risk, up to and including death; or upon the discretion of your surgeon and anesthesiologist your surgery may need to be rescheduled.  Do not eat food after midnight the night before surgery.  No gum chewing or hard candies.  You may however, drink CLEAR liquids up to 2 hours before you are scheduled to arrive for your surgery. Do not drink anything within 2 hours of your scheduled arrival time.  Clear liquids include: - water  - apple juice without pulp - gatorade (not RED colors) - black coffee or tea (Do NOT add milk or creamers to the coffee or tea) Do NOT drink anything that is not on this list.  One week prior to surgery:Last dose will be on 10-05-24 Monday Stop Anti-inflammatories (NSAIDS) such as Advil , Aleve , Ibuprofen , Motrin , Naproxen , Naprosyn  and Aspirin based products such as Excedrin, Goody's Powder, BC Powder. Stop ANY OVER THE COUNTER supplements until after surgery.  You may however, continue to take Tylenol  if needed for pain up until the day of surgery.  Continue taking all of your other prescription medications up until the day of surgery.  ON THE DAY OF SURGERY ONLY TAKE THESE MEDICATIONS WITH SIPS OF WATER: -amLODipine (NORVASC)   Use your Advair Inhaler the morning of surgery and bring your Albuterol  Inhaler to the hospital  No Alcohol for 24 hours before or after surgery.  No Smoking including e-cigarettes for 24 hours before surgery.  No chewable tobacco products for at least 6 hours before  surgery.  No nicotine patches on the day of surgery.  Do not use any recreational drugs for at least a week (preferably 2 weeks) before your surgery.  Please be advised that the combination of cocaine and anesthesia may have negative outcomes, up to and including death. If you test positive for cocaine, your surgery will be cancelled.  On the morning of surgery brush your teeth with toothpaste and water, you may rinse your mouth with mouthwash if you wish. Do not swallow any toothpaste or mouthwash.  Use CHG Soap as directed on instruction sheet.  Do not wear jewelry, make-up, hairpins, clips or nail polish.  For welded (permanent) jewelry: bracelets, anklets, waist bands, etc.  Please have this removed prior to surgery.  If it is not removed, there is a chance that hospital personnel will need to cut it off on the day of surgery.  Do not wear lotions, powders, or perfumes.   Do not shave body hair from the neck down 48 hours before surgery.  Contact lenses, hearing aids and dentures may not be worn into surgery.  Do not bring valuables to the hospital. [facility] is not responsible for any missing/lost belongings or valuables.   Notify your doctor if there is any change in your medical condition (cold, fever, infection).  Wear comfortable clothing (specific to your surgery type) to the hospital.  After surgery, you can help prevent lung complications by doing breathing exercises.  Take deep breaths and cough every 1-2 hours. Your doctor may order a device  called an Facilities Manager to help you take deep breaths. When coughing or sneezing, hold a pillow firmly against your incision with both hands. This is called "splinting." Doing this helps protect your incision. It also decreases belly discomfort.  If you are being admitted to the hospital overnight, leave your suitcase in the car. After surgery it may be brought to your room.  In case of increased patient census, it may  be necessary for you, the patient, to continue your postoperative care in the Same Day Surgery department.  If you are being discharged the day of surgery, you will not be allowed to drive home. You will need a responsible individual to drive you home and stay with you for 24 hours after surgery.   If you are taking public transportation, you will need to have a responsible individual with you.  Please call the Pre-admissions Testing Dept. at [phone] if you have any questions about these instructions.  Surgery Visitation Policy:  Patients having surgery or a procedure may have two visitors.  Children under the age of 75 must have an adult with them who is not the patient.  Inpatient Visitation:    Visiting hours are 7 a.m. to 8 p.m. Up to four visitors are allowed at one time in a patient room. The visitors may rotate out with other people during the day.  One visitor age 56 or older may stay with the patient overnight and must be in the room by 8 p.m.                                                                                                             Preparing for Surgery with CHLORHEXIDINE GLUCONATE (CHG) Soap  Chlorhexidine Gluconate (CHG) Soap  o An antiseptic cleaner that kills germs and bonds with the skin to continue killing germs even after washing  o Used for showering the night before surgery and morning of surgery  Before surgery, you can play an important role by reducing the number of germs on your skin.  CHG (Chlorhexidine gluconate) soap is an antiseptic cleanser which kills germs and bonds with the skin to continue killing germs even after washing.  Please do not use if you have an allergy to CHG or antibacterial soaps. If your skin becomes reddened/irritated stop using the CHG.  1. Shower the NIGHT BEFORE SURGERY with CHG soap.  2. If you choose to wash your hair, wash your hair first as usual with your normal shampoo.  3. After shampooing, rinse  your hair and body thoroughly to remove the shampoo.  4. Use CHG as you would any other liquid soap. You can apply CHG directly to the skin and wash gently with a clean washcloth.  5. Apply the CHG soap to your body only from the neck down. Do not use on open wounds or open sores. Avoid contact with your eyes, ears, mouth, and genitals (private parts). Wash face and genitals (private parts) with your normal soap.  6. Wash thoroughly, paying special attention to the  area where your surgery will be performed.  7. Thoroughly rinse your body with warm water.  8. Do not shower/wash with your normal soap after using and rinsing off the CHG soap.  9. Do not use lotions, oils, etc., after showering with CHG.  10. Pat yourself dry with a clean towel.  11. Wear clean pajamas to bed the night before surgery.  12. Place clean sheets on your bed the night of your shower and do not sleep with pets.  13. Do not apply any deodorants/lotions/powders.  14. Please wear clean clothes to the hospital.  15. Remember to brush your teeth with your regular toothpaste.   Merchandiser, Retail to address health-related social needs:  [email or web address]

## 2024-10-08 ENCOUNTER — Encounter
Admission: RE | Admit: 2024-10-08 | Discharge: 2024-10-08 | Disposition: A | Discharge status: Home / Self Care | Source: Ambulatory Visit | Attending: Otolaryngology | Admitting: Otolaryngology

## 2024-10-08 DIAGNOSIS — Z01812 Encounter for preprocedural laboratory examination: Secondary | ICD-10-CM | POA: Diagnosis not present

## 2024-10-08 DIAGNOSIS — Z01818 Encounter for other preprocedural examination: Secondary | ICD-10-CM | POA: Diagnosis present

## 2024-10-08 DIAGNOSIS — I10 Essential (primary) hypertension: Secondary | ICD-10-CM | POA: Insufficient documentation

## 2024-10-08 DIAGNOSIS — D649 Anemia, unspecified: Secondary | ICD-10-CM | POA: Insufficient documentation

## 2024-10-08 LAB — CBC
HCT: 35.8 % — ABNORMAL LOW (ref 36.0–46.0)
Hemoglobin: 11.9 g/dL — ABNORMAL LOW (ref 12.0–15.0)
MCH: 31.9 pg (ref 26.0–34.0)
MCHC: 33.2 g/dL (ref 30.0–36.0)
MCV: 96 fL (ref 80.0–100.0)
Platelets: 221 K/uL (ref 150–400)
RBC: 3.73 MIL/uL — ABNORMAL LOW (ref 3.87–5.11)
RDW: 12 % (ref 11.5–15.5)
WBC: 4.1 K/uL (ref 4.0–10.5)
nRBC: 0 % (ref 0.0–0.2)

## 2024-10-08 LAB — BASIC METABOLIC PANEL WITH GFR
Anion gap: 9 (ref 5–15)
BUN: 14 mg/dL (ref 6–20)
CO2: 28 mmol/L (ref 22–32)
Calcium: 9 mg/dL (ref 8.9–10.3)
Chloride: 101 mmol/L (ref 98–111)
Creatinine, Ser: 0.79 mg/dL (ref 0.44–1.00)
GFR, Estimated: >60 mL/min (ref >60)
Glucose, Bld: 93 mg/dL (ref 70–99)
Potassium: 4.2 mmol/L (ref 3.5–5.1)
Sodium: 138 mmol/L (ref 135–145)

## 2024-10-13 ENCOUNTER — Encounter
Admission: RE | Disposition: A | Discharge status: Home / Self Care | Payer: Self-pay | Source: Home / Self Care | Attending: Otolaryngology

## 2024-10-13 ENCOUNTER — Other Ambulatory Visit: Payer: Self-pay

## 2024-10-13 ENCOUNTER — Encounter: Payer: Self-pay | Admitting: Otolaryngology

## 2024-10-13 ENCOUNTER — Observation Stay
Admission: RE | Admit: 2024-10-13 | Discharge: 2024-10-14 | Disposition: A | Discharge status: Home / Self Care | Attending: Otolaryngology | Admitting: Otolaryngology

## 2024-10-13 ENCOUNTER — Ambulatory Visit: Admitting: Certified Registered"

## 2024-10-13 DIAGNOSIS — I10 Essential (primary) hypertension: Secondary | ICD-10-CM | POA: Insufficient documentation

## 2024-10-13 DIAGNOSIS — E042 Nontoxic multinodular goiter: Secondary | ICD-10-CM | POA: Diagnosis present

## 2024-10-13 DIAGNOSIS — Z87891 Personal history of nicotine dependence: Secondary | ICD-10-CM | POA: Insufficient documentation

## 2024-10-13 DIAGNOSIS — J45909 Unspecified asthma, uncomplicated: Secondary | ICD-10-CM | POA: Insufficient documentation

## 2024-10-13 DIAGNOSIS — E890 Postprocedural hypothyroidism: Principal | ICD-10-CM

## 2024-10-13 HISTORY — PX: THYROIDECTOMY: SHX17

## 2024-10-13 HISTORY — PX: CONTINUOUS NERVE MONITORING: SHX6650

## 2024-10-13 SURGERY — THYROIDECTOMY
Anesthesia: General | Laterality: Left

## 2024-10-13 MED ORDER — ONDANSETRON HCL 4 MG/2ML IJ SOLN
INTRAMUSCULAR | Status: DC | PRN
  Administered 2024-10-13 (×2): 4 mg via INTRAVENOUS

## 2024-10-13 MED ORDER — ORAL CARE MOUTH RINSE: 15.0000 mL | Freq: Once | OROMUCOSAL | Status: AC

## 2024-10-13 MED ORDER — DEXAMETHASONE SOD PHOSPHATE PF 10 MG/ML IJ SOLN
4.0000 mg | Freq: Two times a day (BID) | INTRAMUSCULAR | Status: DC
  Administered 2024-10-13 – 2024-10-14 (×2): 4 mg via INTRAVENOUS

## 2024-10-13 MED ORDER — FENTANYL CITRATE (PF) 100 MCG/2ML IJ SOLN
25.0000 ug | INTRAMUSCULAR | Status: DC | PRN
  Administered 2024-10-13 (×2): 25 ug via INTRAVENOUS
  Administered 2024-10-13 (×2): 50 ug via INTRAVENOUS

## 2024-10-13 MED ORDER — GLYCOPYRROLATE 0.2 MG/ML IJ SOLN
INTRAMUSCULAR | Status: DC | PRN
  Administered 2024-10-13: .2 mg via INTRAVENOUS

## 2024-10-13 MED ORDER — ONDANSETRON HCL 4 MG PO TABS: 4.0000 mg | ORAL_TABLET | ORAL | Status: DC | PRN

## 2024-10-13 MED ORDER — DEXMEDETOMIDINE HCL IN NACL 200 MCG/50ML IV SOLN
INTRAVENOUS | Status: DC | PRN
  Administered 2024-10-13 (×3): 8 ug via INTRAVENOUS
  Administered 2024-10-13: 4 ug via INTRAVENOUS

## 2024-10-13 MED ORDER — OXYCODONE HCL 5 MG/5ML PO SOLN
5.0000 mg | ORAL | Status: DC | PRN
  Administered 2024-10-13 – 2024-10-14 (×4): 5 mg via ORAL
  Filled 2024-10-13 (×4): qty 5

## 2024-10-13 MED ORDER — CHLORHEXIDINE GLUCONATE 0.12 % MT SOLN
15.0000 mL | Freq: Once | OROMUCOSAL | Status: AC
  Administered 2024-10-13: 15 mL via OROMUCOSAL

## 2024-10-13 MED ORDER — ACETAMINOPHEN 10 MG/ML IV SOLN
INTRAVENOUS | Status: DC | PRN
  Administered 2024-10-13: 1000 mg via INTRAVENOUS

## 2024-10-13 MED ORDER — MIDAZOLAM HCL (PF) 2 MG/2ML IJ SOLN
INTRAMUSCULAR | Status: DC | PRN
  Administered 2024-10-13: 2 mg via INTRAVENOUS

## 2024-10-13 MED ORDER — FLUTICASONE FUROATE-VILANTEROL 200-25 MCG/ACT IN AEPB
1.0000 | INHALATION_SPRAY | Freq: Every day | RESPIRATORY_TRACT | Status: DC
  Filled 2024-10-13: qty 28

## 2024-10-13 MED ORDER — ONDANSETRON HCL 4 MG/2ML IJ SOLN
4.0000 mg | INTRAMUSCULAR | Status: DC | PRN
Start: 2024-10-13 — End: 2024-10-14

## 2024-10-13 MED ORDER — OXYCODONE HCL 5 MG PO TABS
ORAL_TABLET | ORAL | Status: AC
  Filled 2024-10-13: qty 1

## 2024-10-13 MED ORDER — FENTANYL CITRATE (PF) 100 MCG/2ML IJ SOLN
INTRAMUSCULAR | Status: AC
  Filled 2024-10-13: qty 2

## 2024-10-13 MED ORDER — PHENYLEPHRINE 80 MCG/ML (10ML) SYRINGE FOR IV PUSH (FOR BLOOD PRESSURE SUPPORT)
PREFILLED_SYRINGE | INTRAVENOUS | Status: DC | PRN
  Administered 2024-10-13: 80 ug via INTRAVENOUS

## 2024-10-13 MED ORDER — CYCLOBENZAPRINE HCL 10 MG PO TABS
5.0000 mg | ORAL_TABLET | Freq: Three times a day (TID) | ORAL | Status: DC | PRN
  Administered 2024-10-13 – 2024-10-14 (×2): 5 mg via ORAL
  Filled 2024-10-13 (×2): qty 1

## 2024-10-13 MED ORDER — MIDAZOLAM HCL 2 MG/2ML IJ SOLN
INTRAMUSCULAR | Status: AC
  Filled 2024-10-13: qty 2

## 2024-10-13 MED ORDER — REMIFENTANIL HCL 1 MG IV SOLR
INTRAVENOUS | Status: AC
  Filled 2024-10-13: qty 1000

## 2024-10-13 MED ORDER — BUPIVACAINE-EPINEPHRINE (PF) 0.25% -1:200000 IJ SOLN
INTRAMUSCULAR | Status: DC | PRN
  Administered 2024-10-13: 8 mL

## 2024-10-13 MED ORDER — ACETAMINOPHEN 650 MG RE SUPP: 650.0000 mg | RECTAL | Status: DC | PRN

## 2024-10-13 MED ORDER — DEXAMETHASONE SOD PHOSPHATE PF 10 MG/ML IJ SOLN
INTRAMUSCULAR | Status: DC | PRN
  Administered 2024-10-13: 10 mg via INTRAVENOUS

## 2024-10-13 MED ORDER — ACETAMINOPHEN 160 MG/5ML PO SOLN: 650.0000 mg | ORAL | Status: DC | PRN

## 2024-10-13 MED ORDER — PROPOFOL 500 MG/50ML IV EMUL
INTRAVENOUS | Status: DC | PRN
  Administered 2024-10-13: 145 ug/kg/min via INTRAVENOUS

## 2024-10-13 MED ORDER — ACETAMINOPHEN 10 MG/ML IV SOLN
INTRAVENOUS | Status: AC
  Filled 2024-10-13: qty 100

## 2024-10-13 MED ORDER — LACTATED RINGERS IV SOLN: INTRAVENOUS | Status: DC

## 2024-10-13 MED ORDER — MORPHINE SULFATE (PF) 2 MG/ML IV SOLN: 2.0000 mg | INTRAVENOUS | Status: DC | PRN

## 2024-10-13 MED ORDER — BACITRACIN ZINC 500 UNIT/GM EX OINT
TOPICAL_OINTMENT | CUTANEOUS | Status: AC
Start: 2024-10-13 — End: 2024-10-13
  Filled 2024-10-13: qty 28.35

## 2024-10-13 MED ORDER — PROPOFOL 10 MG/ML IV BOLUS
INTRAVENOUS | Status: AC
  Filled 2024-10-13: qty 20

## 2024-10-13 MED ORDER — 0.9 % SODIUM CHLORIDE (POUR BTL) OPTIME
TOPICAL | Status: DC | PRN
Start: 2024-10-13 — End: 2024-10-13
  Administered 2024-10-13: 500 mL

## 2024-10-13 MED ORDER — LIDOCAINE HCL (CARDIAC) PF 100 MG/5ML IV SOSY
PREFILLED_SYRINGE | INTRAVENOUS | Status: DC | PRN
  Administered 2024-10-13: 100 mg via INTRAVENOUS

## 2024-10-13 MED ORDER — BUPIVACAINE-EPINEPHRINE (PF) 0.25% -1:200000 IJ SOLN
INTRAMUSCULAR | Status: AC
  Filled 2024-10-13: qty 30

## 2024-10-13 MED ORDER — ALBUTEROL SULFATE (2.5 MG/3ML) 0.083% IN NEBU: 2.5000 mg | INHALATION_SOLUTION | Freq: Four times a day (QID) | RESPIRATORY_TRACT | Status: DC | PRN

## 2024-10-13 MED ORDER — LOSARTAN POTASSIUM 50 MG PO TABS
50.0000 mg | ORAL_TABLET | ORAL | Status: DC
  Administered 2024-10-14: 50 mg via ORAL
  Filled 2024-10-13: qty 1

## 2024-10-13 MED ORDER — REMIFENTANIL HCL 1 MG IV SOLR
INTRAVENOUS | Status: DC | PRN
  Administered 2024-10-13: .2 ug/kg/min via INTRAVENOUS

## 2024-10-13 MED ORDER — CHLORHEXIDINE GLUCONATE 0.12 % MT SOLN
OROMUCOSAL | Status: AC
  Filled 2024-10-13: qty 15

## 2024-10-13 MED ORDER — AMLODIPINE BESYLATE 5 MG PO TABS
2.5000 mg | ORAL_TABLET | ORAL | Status: DC
  Administered 2024-10-14: 2.5 mg via ORAL

## 2024-10-13 MED ORDER — FENTANYL CITRATE (PF) 100 MCG/2ML IJ SOLN
INTRAMUSCULAR | Status: DC | PRN
  Administered 2024-10-13: 50 ug via INTRAVENOUS
  Administered 2024-10-13 (×2): 25 ug via INTRAVENOUS

## 2024-10-13 MED ORDER — DEXTROSE-SODIUM CHLORIDE 5-0.45 % IV SOLN: INTRAVENOUS | Status: DC

## 2024-10-13 MED ORDER — PROPOFOL 10 MG/ML IV BOLUS
INTRAVENOUS | Status: DC | PRN
  Administered 2024-10-13: 200 mg via INTRAVENOUS
  Administered 2024-10-13: 100 mg via INTRAVENOUS

## 2024-10-13 MED ORDER — OXYCODONE HCL 5 MG PO TABS
5.0000 mg | ORAL_TABLET | ORAL | Status: AC | PRN
  Administered 2024-10-13: 5 mg via ORAL

## 2024-10-13 MED ORDER — DROPERIDOL 2.5 MG/ML IJ SOLN: 0.6250 mg | Freq: Once | INTRAMUSCULAR | Status: DC | PRN

## 2024-10-13 MED ORDER — GABAPENTIN 100 MG PO CAPS
300.0000 mg | ORAL_CAPSULE | Freq: Every day | ORAL | Status: DC
  Administered 2024-10-13: 300 mg via ORAL
  Filled 2024-10-13: qty 3

## 2024-10-13 MED ORDER — PHENYLEPHRINE HCL-NACL 20-0.9 MG/250ML-% IV SOLN
INTRAVENOUS | Status: DC | PRN
  Administered 2024-10-13: 30 ug/min via INTRAVENOUS

## 2024-10-13 MED ORDER — SUCCINYLCHOLINE CHLORIDE 200 MG/10ML IV SOSY
PREFILLED_SYRINGE | INTRAVENOUS | Status: DC | PRN
  Administered 2024-10-13: 100 mg via INTRAVENOUS

## 2024-10-13 MED ORDER — EPHEDRINE SULFATE-NACL 50-0.9 MG/10ML-% IV SOSY
PREFILLED_SYRINGE | INTRAVENOUS | Status: DC | PRN
  Administered 2024-10-13 (×2): 5 mg via INTRAVENOUS

## 2024-10-13 MED ORDER — SENNOSIDES-DOCUSATE SODIUM 8.6-50 MG PO TABS: 1.0000 | ORAL_TABLET | Freq: Every evening | ORAL | Status: DC | PRN

## 2024-10-13 SURGICAL SUPPLY — 32 items
BLADE SURG 15 STRL LF DISP TIS (BLADE) ×1 IMPLANT
CLEANER CAUTERY TIP PAD (MISCELLANEOUS) ×1 IMPLANT
CORD BIP STRL DISP 12FT (MISCELLANEOUS) ×1 IMPLANT
DERMABOND ADVANCED .7 DNX12 (GAUZE/BANDAGES/DRESSINGS) IMPLANT
DRAIN WOUND RND W/TROCAR (DRAIN) IMPLANT
DRSG TEGADERM 2-3/8X2-3/4 SM (GAUZE/BANDAGES/DRESSINGS) IMPLANT
ELECT LARYNGEAL DUAL CHAN (ELECTRODE) ×1 IMPLANT
ELECTRODE NDL 20X.3 GREEN (MISCELLANEOUS) ×1 IMPLANT
ELECTRODE REM PT RTRN 9FT ADLT (ELECTROSURGICAL) ×1 IMPLANT
EVACUATOR SILICONE 100CC (DRAIN) IMPLANT
FORCEPS JEWEL BIP 4-3/4 STR (INSTRUMENTS) ×1 IMPLANT
GAUZE 4X4 16PLY ~~LOC~~+RFID DBL (SPONGE) ×1 IMPLANT
GLOVE BIO SURGEON STRL SZ7.5 (GLOVE) ×2 IMPLANT
GOWN STRL REUS W/ TWL LRG LVL3 (GOWN DISPOSABLE) ×3 IMPLANT
HEMOSTAT SURGICEL 2X3 (HEMOSTASIS) ×1 IMPLANT
HOOK STAY BLUNT/RETRACTOR 5M (MISCELLANEOUS) IMPLANT
KIT TURNOVER KIT A (KITS) ×1 IMPLANT
LABEL OR SOLS (LABEL) ×1 IMPLANT
MANIFOLD NEPTUNE II (INSTRUMENTS) ×1 IMPLANT
NS IRRIG 500ML POUR BTL (IV SOLUTION) ×1 IMPLANT
PACK HEAD/NECK (MISCELLANEOUS) ×1 IMPLANT
PAD MAGNETIC INSTR ST 16X20 (MISCELLANEOUS) ×1 IMPLANT
PROBE NEUROSIGN BIPOL (MISCELLANEOUS) ×1 IMPLANT
SHEARS HARMONIC 9CM CVD (BLADE) ×1 IMPLANT
SOLUTION PREP PVP 2OZ (MISCELLANEOUS) ×1 IMPLANT
SPONGE KITTNER 5P (MISCELLANEOUS) ×1 IMPLANT
STRIP CLOSURE SKIN 1/4X4 (GAUZE/BANDAGES/DRESSINGS) IMPLANT
SUT PROLENE 6 0 P 1 18 (SUTURE) ×1 IMPLANT
SUT SILK 2 0 SH (SUTURE) ×1 IMPLANT
SUT SILK 2-0 18XBRD TIE 12 (SUTURE) ×1 IMPLANT
SUT VIC AB 4-0 RB1 18 (SUTURE) ×1 IMPLANT
TRAP FLUID SMOKE EVACUATOR (MISCELLANEOUS) ×1 IMPLANT

## 2024-10-13 NOTE — H&P (Signed)
..  History and Physical paper copy reviewed and updated date of procedure and will be scanned into system.  Patient seen and examined and marked.  New HP created.

## 2024-10-13 NOTE — Op Note (Signed)
 ..10/13/2024  11:11 AM    Rene Iha  [id number]   Pre-Op Dx: THYROID  NODULE, NONTOXIC MULTINODULAR GOITER with compressive symptoms  Post-op Dx: SAME  Proc: Left Hemithyroidectomy with laryngeal nerve monitoring  Surg:  Carolee Hunter  Assistant:  McQueen  Anes:  GOT  EBL:  <10ccs  Comp:  None  Indications:  Left sided thyroid  nodule with compressive symptoms.  Findings: Left recurrent laryngeal nerve identified and preserved, left superior and inferior parathyroid glands identified and preserved.  Large left sided thyroid  nodule.  Description of Procedure:  After the patient was identified in hold and the history and physical and consent was reviewed and updated.  The patient was marked in an upright position on the left side along a natural occuring skin crease.  The patient was next taken to the operating room and placed in a supine position.  General endotracheal anesthesia was induced with laryngeal monitor endotracheal tube.  Direct visualization by the surgeon of the tube electrodes in contact with the vocal cords was made..  The patient's anterior marked neck crease was neck injected with 8 cc's of 0.25% Marcaine  with 1:200,000 Epinephrine .  The patient was next prepped and draped in a sterile normal fashion.  At this time, a 15 blade scalpel was used to make a skin incision along a previously marked anterior neck crease.  Dissection was carefully performed through the subcutaneous tissues with combination of Bovie electrocautery and blunt dissection.  The platysma was incised and anterior neck veins ligated with harmonic scalpel.  The median raphe of the strap muscles was divided in a linear fashion with Bovie electrocautery until the anterior border of the thyroid  gland was identified.  The sternohyoid muscle was bluntly dissected away from the large left hemithyroid gland.  The lateral border of the thyroid  and the carotid artery was identified.  Dissection bluntly  and with Bovie electrocautery was continued superiorly and inferiorly along the lateral edge of the thyroid .  The superior vessels were identified laterally and then medially with blunt dissection in Joel's space between the larynx and the superior thyroid  pole.  Further dissection was continued inferiorly as well.  Once the superior pole was pedicled, the superior thyroid  vessels were ligated with Harmonic Scalpel.  The large left hemithyroid was delivered from the wound.  The large left thyroid  nodule once delivered from the wound revealed Berry's ligament and the nodule of Zuckerkandl.  Just beneath this nodule, the recurrent laryngeal nerve was identified and stimulated with movement of the arytenoid joint.  The inferior parathyroid was identified adjacent to the nerve along with the superior parathyroid as well.  These were identified and revealed good vascularization.  The nerve was tracked until its insertion into the larynx.  Next, the remaining attachments of Berry's ligament was divided and the isthmus was divided with Harmonic Scalpel.  The wound was copiously irrigated with sterile saline.  Meticulous hemostasis with bipolar was obtained.  Visualization of an intact left recurrent laryngeal nerve that stimulated was made.  The parathyroid glands were intact and well perfused.  Surgicel was placed in the wound bed.  The wound was then closed in a multilayered fashion with vicryl for subcutaneous tissues and Dermabond for skin closure.    At this time the patient was extubated and taken to PACU in good condition.  Plan:  Follow pathology.  Limit activity for 2 weeks.  Admit for observation.  Follow up next week for post-operative evaluation and suture removal.  Jamielynn Wigley  10/13/2024 11:11  AM

## 2024-10-13 NOTE — Transfer of Care (Signed)
 Immediate Anesthesia Transfer of Care Note  Patient: Mary Hicks  Procedure(s) Performed: THYROIDECTOMY (Left) NERVE MONITORING, INTRAOPERATIVE (Left)  Patient Location: PACU  Anesthesia Type:General  Level of Consciousness: awake, drowsy, and patient cooperative  Airway & Oxygen Therapy: Patient Spontanous Breathing and Patient connected to face mask oxygen  Post-op Assessment: Report given to RN and Post -op Vital signs reviewed and stable  Post vital signs: Reviewed and stable  Last Vitals:  Vitals Value Taken Time  BP 125/97 10/13/24 11:16  Temp    Pulse 90 10/13/24 11:18  Resp 12 10/13/24 11:18  SpO2 94 % 10/13/24 11:18  Vitals shown include unfiled device data.  Last Pain:  Vitals:   10/13/24 0846  TempSrc: Temporal  PainSc: 0-No pain         Complications: No notable events documented.

## 2024-10-13 NOTE — Anesthesia Preprocedure Evaluation (Signed)
 Anesthesia Evaluation  Patient identified by MRN, date of birth, ID band Patient awake    Reviewed: Allergy & Precautions, H&P , NPO status , Patient's Chart, lab work & pertinent test results, reviewed documented beta blocker date and time   History of Anesthesia Complications Negative for: history of anesthetic complications  Airway Mallampati: II  TM Distance: >3 FB Neck ROM: full    Dental  (+) Dental Advidsory Given, Teeth Intact, Missing   Pulmonary neg shortness of breath, asthma , neg sleep apnea, neg COPD, neg recent URI, former smoker   Pulmonary exam normal breath sounds clear to auscultation       Cardiovascular Exercise Tolerance: Good hypertension, (-) angina (-) Past MI and (-) Cardiac Stents Normal cardiovascular exam(-) dysrhythmias (-) Valvular Problems/Murmurs Rhythm:regular Rate:Normal     Neuro/Psych  PSYCHIATRIC DISORDERS Anxiety Depression    negative neurological ROS     GI/Hepatic negative GI ROS, Neg liver ROS,,,  Endo/Other  neg diabetes    Renal/GU negative Renal ROS  negative genitourinary   Musculoskeletal   Abdominal   Peds  Hematology negative hematology ROS (+)   Anesthesia Other Findings Past Medical History: No date: Asthma No date: Hypertension No date: Urinary urgency   Reproductive/Obstetrics negative OB ROS                              Anesthesia Physical Anesthesia Plan  ASA: 2  Anesthesia Plan: General   Post-op Pain Management:    Induction: Intravenous  PONV Risk Score and Plan: Ondansetron , Dexamethasone , Midazolam  and Treatment may vary due to age or medical condition  Airway Management Planned: Natural Airway and Nasal Cannula  Additional Equipment:   Intra-op Plan:   Post-operative Plan:   Informed Consent: I have reviewed the patients History and Physical, chart, labs and discussed the procedure including the risks,  benefits and alternatives for the proposed anesthesia with the patient or authorized representative who has indicated his/her understanding and acceptance.     Dental Advisory Given  Plan Discussed with: Anesthesiologist, CRNA and Surgeon  Anesthesia Plan Comments:          Anesthesia Quick Evaluation

## 2024-10-13 NOTE — Anesthesia Procedure Notes (Signed)
 Procedure Name: Intubation Date/Time: 10/13/2024 9:29 AM  Performed by: Ledora Duncan, CRNAPre-anesthesia Checklist: Patient identified, Emergency Drugs available, Suction available and Patient being monitored Patient Re-evaluated:Patient Re-evaluated prior to induction Oxygen Delivery Method: Circle system utilized Preoxygenation: Pre-oxygenation with 100% oxygen Induction Type: IV induction Ventilation: Mask ventilation without difficulty Laryngoscope Size: McGrath and 3 Grade View: Grade I Tube type: Oral Tube size: 6.5 mm Number of attempts: 1 Airway Equipment and Method: Stylet Placement Confirmation: ETT inserted through vocal cords under direct vision, positive ETCO2 and breath sounds checked- equal and bilateral Secured at: 21 cm Tube secured with: Tape Dental Injury: Teeth and Oropharynx as per pre-operative assessment

## 2024-10-13 NOTE — Progress Notes (Signed)
..  10/13/2024 5:22 PM  Mary Hicks [id number]  Post-Op Day 0    Temp:  [96.9 F (36.1 C)-97.7 F (36.5 C)] 97.5 F (36.4 C) (11/25 1436) Pulse Rate:  [52-101] 54 (11/25 1436) Resp:  [7-18] 15 (11/25 1436) BP: (111-131)/(70-114) 129/73 (11/25 1436) SpO2:  [96 %-100 %] 98 % (11/25 1436) Weight:  [108.9 kg] 108.9 kg (11/25 0846),     Intake/Output Summary (Last 24 hours) at 10/13/2024 1722 Last data filed at 10/13/2024 1525 Gross per 24 hour  Intake 654.34 ml  Output 305 ml  Net 349.34 ml    No results found for this or any previous visit (from the past 24 hours).  SUBJECTIVE:  Doing well.  Reports film in throat and some intermittent pain.  Using ice.  Toleraing liquids.  Voiding.  OBJECTIVE:  GEN-  NAD, sitting upright in bed NECK-  ice pack in place.  Mild edema beneath  IMPRESSION:  s/p Left hemithryoidectomy doing well  PLAN:  observe overnight.  Advance diet.  Saline lock IV.  Anticipate d/c in morning.  Flornce Record 10/13/2024, 5:22 PM

## 2024-10-14 ENCOUNTER — Encounter: Payer: Self-pay | Admitting: Otolaryngology

## 2024-10-14 DIAGNOSIS — E042 Nontoxic multinodular goiter: Secondary | ICD-10-CM | POA: Diagnosis not present

## 2024-10-14 MED ORDER — AMLODIPINE BESYLATE 5 MG PO TABS
ORAL_TABLET | ORAL | Status: AC
Start: 2024-10-14 — End: 2024-10-14
  Filled 2024-10-14: qty 1

## 2024-10-14 MED ORDER — OXYCODONE HCL 5 MG/5ML PO SOLN: 5.0000 mg | ORAL | 0 refills | Status: DC | PRN

## 2024-10-14 MED ORDER — SENNOSIDES-DOCUSATE SODIUM 8.6-50 MG PO TABS: 1.0000 | ORAL_TABLET | Freq: Every evening | ORAL | 0 refills | Status: DC | PRN

## 2024-10-14 MED ORDER — ONDANSETRON HCL 4 MG PO TABS: 4.0000 mg | ORAL_TABLET | ORAL | 0 refills | Status: AC | PRN

## 2024-10-14 MED ORDER — PREDNISONE 10 MG (21) PO TBPK: ORAL_TABLET | ORAL | 0 refills | Status: DC

## 2024-10-14 NOTE — Final Progress Note (Signed)
..  10/14/2024 6:22 AM  Mary Hicks [id number]  Post-Op Day 1    Temp:  [96.9 F (36.1 C)-98.2 F (36.8 C)] 98 F (36.7 C) (11/26 0439) Pulse Rate:  [52-101] 52 (11/26 0439) Resp:  [7-18] 14 (11/26 0439) BP: (111-131)/(70-114) 130/88 (11/26 0439) SpO2:  [96 %-100 %] 97 % (11/26 0439) Weight:  [108.9 kg] 108.9 kg (11/25 0846),     Intake/Output Summary (Last 24 hours) at 10/14/2024 0622 Last data filed at 10/13/2024 1525 Gross per 24 hour  Intake 654.34 ml  Output 305 ml  Net 349.34 ml    No results found for this or any previous visit (from the past 24 hours).  SUBJECTIVE:  No acute events overnight.  Tolerating PO.  Ambulating to restroom.  Pain controlled with current medications.  OBJECTIVE:  GEN-  NAD NECK-  Incision c/d/I.  Minimal edema  IMPRESSION:  s/p left hemithryoidectomy POD#1  PLAN:  Cleared for discharge home.  Medications sent.  Follow up next week.  Follow pathology.  Mary Hicks 10/14/2024, 6:22 AM

## 2024-10-14 NOTE — Plan of Care (Signed)
   Problem: Activity: Goal: Ability to tolerate increased activity will improve Outcome: Progressing

## 2024-10-14 NOTE — Progress Notes (Signed)
 DISCHARGE NOTE:   Pt dc with IV removed and instructions given. Pt given ice pack for at home use and educated on medication usage for at home. Pt voices no questions or concerns at this time. Pt wheeled down to medical mall entrance by staff. Pt's son provided transportation.

## 2024-10-14 NOTE — Plan of Care (Signed)
  Problem: Education: Goal: Knowledge of the prescribed therapeutic regimen will improve Outcome: Progressing   Problem: Activity: Goal: Ability to tolerate increased activity will improve Outcome: Progressing   Problem: Health Behavior/Discharge Planning: Goal: Identification of resources available to assist in meeting health care needs will improve Outcome: Progressing   Problem: Nutrition: Goal: Maintenance of adequate nutrition will improve Outcome: Progressing   Problem: Clinical Measurements: Goal: Complications related to the disease process, condition or treatment will be avoided or minimized Outcome: Progressing   Problem: Respiratory: Goal: Will regain and/or maintain adequate ventilation Outcome: Progressing

## 2024-10-16 LAB — SURGICAL PATHOLOGY

## 2024-10-22 NOTE — Anesthesia Postprocedure Evaluation (Signed)
 Anesthesia Post Note  Patient: Mary Hicks  Procedure(s) Performed: THYROIDECTOMY (Left) NERVE MONITORING, INTRAOPERATIVE (Left)  Patient location during evaluation: PACU Anesthesia Type: General Level of consciousness: awake and alert Pain management: pain level controlled Vital Signs Assessment: post-procedure vital signs reviewed and stable Respiratory status: spontaneous breathing, nonlabored ventilation, respiratory function stable and patient connected to nasal cannula oxygen Cardiovascular status: blood pressure returned to baseline and stable Postop Assessment: no apparent nausea or vomiting Anesthetic complications: no   No notable events documented.   Last Vitals:  Vitals:   10/14/24 0738 10/14/24 1056  BP: 129/81 123/76  Pulse: (!) 58 68  Resp: 15 17  Temp: 36.7 C 36.8 C  SpO2: 100% 100%    Last Pain:  Vitals:   10/14/24 1056  TempSrc: Oral  PainSc: 5                  Prentice Murphy

## 2024-11-15 ENCOUNTER — Other Ambulatory Visit: Payer: Self-pay

## 2024-11-15 ENCOUNTER — Emergency Department
Admission: EM | Admit: 2024-11-15 | Discharge: 2024-11-15 | Disposition: A | Discharge status: Home / Self Care | Attending: Emergency Medicine | Admitting: Emergency Medicine

## 2024-11-15 DIAGNOSIS — U071 COVID-19: Secondary | ICD-10-CM | POA: Diagnosis not present

## 2024-11-15 DIAGNOSIS — R509 Fever, unspecified: Secondary | ICD-10-CM | POA: Diagnosis present

## 2024-11-15 LAB — RESP PANEL BY RT-PCR (RSV, FLU A&B, COVID)  RVPGX2
Influenza A by PCR: NEGATIVE
Influenza B by PCR: NEGATIVE
Resp Syncytial Virus by PCR: NEGATIVE
SARS Coronavirus 2 by RT PCR: POSITIVE — AB

## 2024-11-15 MED ORDER — PSEUDOEPH-BROMPHEN-DM 30-2-10 MG/5ML PO SYRP: 5.0000 mL | ORAL_SOLUTION | Freq: Four times a day (QID) | ORAL | 0 refills | Status: DC | PRN

## 2024-11-15 MED ORDER — PAXLOVID (300/100) 20 X 150 MG & 10 X 100MG PO TBPK: 3.0000 | ORAL_TABLET | Freq: Two times a day (BID) | ORAL | 0 refills | Status: AC

## 2024-11-15 MED ORDER — OXYCODONE-ACETAMINOPHEN 5-325 MG PO TABS: 1.0000 | ORAL_TABLET | Freq: Three times a day (TID) | ORAL | 0 refills | Status: AC | PRN

## 2024-11-15 MED ORDER — BENZONATATE 200 MG PO CAPS: 200.0000 mg | ORAL_CAPSULE | Freq: Three times a day (TID) | ORAL | 0 refills | Status: DC | PRN

## 2024-11-15 NOTE — ED Provider Notes (Signed)
 "   [facility] Emergency Department Provider Note     Event Date/Time   First MD Initiated Contact with Patient 11/15/24 1512     (approximate)   History   Fever   HPI  Mary Hicks is a 43 y.o. female presents to the ED with c/o cough, fever, and congestion.  she notes 3 days of symptoms.  Patient is also endorsing some sore throat, as she is 4 weeks status post thyroidectomy.   Physical Exam   Triage Vital Signs: ED Triage Vitals  Encounter Vitals Group     BP 11/15/24 1427 (!) 155/99     Girls Systolic BP Percentile --      Girls Diastolic BP Percentile --      Boys Systolic BP Percentile --      Boys Diastolic BP Percentile --      Pulse Rate 11/15/24 1427 94     Resp 11/15/24 1427 18     Temp 11/15/24 1427 98 F (36.7 C)     Temp src --      SpO2 11/15/24 1427 100 %     Weight 11/15/24 1425 230 lb (104.3 kg)     Height 11/15/24 1425 5' 7.5 (1.715 m)     Head Circumference --      Peak Flow --      Pain Score 11/15/24 1425 5     Pain Loc --      Pain Education --      Exclude from Growth Chart --     Most recent vital signs: Vitals:   11/15/24 1427 11/15/24 1522  BP: (!) 155/99   Pulse: 94   Resp: 18   Temp: 98 F (36.7 C)   SpO2: 100% 100%    General Awake, no distress. NAD HEENT NCAT. PERRL. EOMI. No rhinorrhea. Mucous membranes are moist.  CV:  Good peripheral perfusion.  RESP:  Normal effort.    ED Results / Procedures / Treatments   Labs (all labs ordered are listed, but only abnormal results are displayed) Labs Reviewed  RESP PANEL BY RT-PCR (RSV, FLU A&B, COVID)  [id number] - Abnormal; Notable for the following components:      Result Value   SARS Coronavirus 2 by RT PCR POSITIVE (*)    All other components within normal limits     EKG    RADIOLOGY  No results found.   PROCEDURES:  Critical Care performed: No  Procedures   MEDICATIONS ORDERED IN ED: Medications - No data to  display   IMPRESSION / MDM / ASSESSMENT AND PLAN / ED COURSE  I reviewed the triage vital signs and the nursing notes.                              Differential diagnosis includes, but is not limited to, covid, flu, RSV, viral URI  Patient's presentation is most consistent with acute complicated illness / injury requiring diagnostic workup.  Patient's diagnosis is consistent with COVID.  Viral etiology is confirmed with PCR viral panel test.  Patient will be discharged home with prescriptions for Paxlovid , Tessalon  Perles, Bromfed syrup, and oxycodone  (#15). Patient is to follow up with her PCP or ENT as suggested, as needed or otherwise directed. Patient is given ED precautions to return to the ED for any worsening or new symptoms.     FINAL CLINICAL IMPRESSION(S) / ED DIAGNOSES   Final diagnoses:  COVID     Rx / DC Orders   ED Discharge Orders          Ordered    benzonatate  (TESSALON ) 200 MG capsule  3 times daily PRN        11/15/24 1536    brompheniramine-pseudoephedrine-DM 30-2-10 MG/5ML syrup  4 times daily PRN        11/15/24 1536    nirmatrelvir/ritonavir (PAXLOVID , 300/100,) 20 x 150 MG & 10 x 100MG  TBPK  2 times daily        11/15/24 1536    oxyCODONE -acetaminophen  (PERCOCET) 5-325 MG tablet  Every 8 hours PRN        11/15/24 1539             Note:  This document was prepared using Dragon voice recognition software and may include unintentional dictation errors.    Loyd Candida LULLA Aldona, PA-C 11/15/24 1632    Bradler, Evan K, MD 11/15/24 [phone]  "

## 2024-11-15 NOTE — Discharge Instructions (Addendum)
 Take the OTC and prescription meds as directed. Follow-up with your primary provider as needed.

## 2024-11-15 NOTE — ED Triage Notes (Signed)
 Pt comes with cough fever an congestion for three days.

## 2024-11-29 ENCOUNTER — Emergency Department

## 2024-11-29 ENCOUNTER — Other Ambulatory Visit: Payer: Self-pay

## 2024-11-29 ENCOUNTER — Emergency Department: Admission: EM | Admit: 2024-11-29 | Discharge: 2024-11-29 | Disposition: A | Discharge status: Home / Self Care

## 2024-11-29 ENCOUNTER — Encounter: Payer: Self-pay | Admitting: Emergency Medicine

## 2024-11-29 DIAGNOSIS — I10 Essential (primary) hypertension: Secondary | ICD-10-CM | POA: Diagnosis not present

## 2024-11-29 DIAGNOSIS — R519 Headache, unspecified: Secondary | ICD-10-CM | POA: Insufficient documentation

## 2024-11-29 DIAGNOSIS — H53149 Visual discomfort, unspecified: Secondary | ICD-10-CM | POA: Insufficient documentation

## 2024-11-29 DIAGNOSIS — J45909 Unspecified asthma, uncomplicated: Secondary | ICD-10-CM | POA: Diagnosis not present

## 2024-11-29 LAB — RESP PANEL BY RT-PCR (RSV, FLU A&B, COVID)  RVPGX2
Influenza A by PCR: NEGATIVE
Influenza B by PCR: NEGATIVE
Resp Syncytial Virus by PCR: NEGATIVE
SARS Coronavirus 2 by RT PCR: NEGATIVE

## 2024-11-29 MED ORDER — KETOROLAC TROMETHAMINE 30 MG/ML IJ SOLN
30.0000 mg | Freq: Once | INTRAMUSCULAR | Status: AC
  Administered 2024-11-29: 30 mg via INTRAMUSCULAR
  Filled 2024-11-29: qty 1

## 2024-11-29 MED ORDER — FLUCONAZOLE 150 MG PO TABS: 150.0000 mg | ORAL_TABLET | Freq: Every day | ORAL | 0 refills | Status: AC

## 2024-11-29 MED ORDER — BUTALBITAL-APAP-CAFFEINE 50-325-40 MG PO TABS
1.0000 | ORAL_TABLET | Freq: Once | ORAL | Status: AC
  Administered 2024-11-29: 1 via ORAL
  Filled 2024-11-29: qty 1

## 2024-11-29 MED ORDER — AMOXICILLIN 500 MG PO TABS: 500.0000 mg | ORAL_TABLET | Freq: Three times a day (TID) | ORAL | 0 refills | Status: AC

## 2024-11-29 MED ORDER — NAPROXEN 500 MG PO TABS: 500.0000 mg | ORAL_TABLET | Freq: Two times a day (BID) | ORAL | 0 refills | Status: DC

## 2024-11-29 NOTE — ED Provider Notes (Signed)
 "  [facility] Provider Note    Event Date/Time   First MD Initiated Contact with Patient 11/29/24 0703     (approximate)   History   Headache and Sinus Issues   HPI  Mary Hicks is a 44 y.o. female  with history of recent Covid,  hypertension, asthma, anxiety, anemia and as listed in EMR presents to the emergency department for treatment and evaluation of right side temporal headache that started yesterday. She was able to go to sleep after taking Nyquil, but awakened again with headache. Pain is on the right forehead, temple area and maxillary sinus area. She initially related it to sinus pressure, but when it woke her up after nyquil, she decided to come to the ER. She reports photophobia. No vision changes, no nausea. No fever. She has never had this type of headache.     Physical Exam    Vitals:   11/29/24 0536  BP: (!) 179/108  Pulse: 86  Resp: 18  Temp: 99 F (37.2 C)  SpO2: 98%    General: Awake, alert and oriented CV:  Good peripheral perfusion.  Resp:  Normal effort.  Abd:  No distention.  Other:  No focal abnormality noted on neurological exam.  Pupils equal, round, reactive to light, 2 mm   ED Results / Procedures / Treatments   Labs (all labs ordered are listed, but only abnormal results are displayed)  Labs Reviewed  RESP PANEL BY RT-PCR (RSV, FLU A&B, COVID)  [id number]     EKG  Not indicated   RADIOLOGY  Image and radiology report reviewed and interpreted by me. Radiology report consistent with the same.  CT head negative for acute concerns.  PROCEDURES:  Critical Care performed: No  Procedures   MEDICATIONS ORDERED IN ED:  Medications  ketorolac  (TORADOL ) 30 MG/ML injection 30 mg (30 mg Intramuscular Given 11/29/24 0731)  butalbital -acetaminophen -caffeine  (FIORICET ) 50-325-40 MG per tablet 1 tablet (1 tablet Oral Given 11/29/24 9177)     IMPRESSION / MDM / ASSESSMENT AND PLAN / ED COURSE   I  have reviewed the triage note and vital signs. Vital signs--hypertension with history of the same, oral temperature 99 degrees, pulse rate 86, respiratory rate 18, and SpO2 90% on room air   Differential diagnosis includes, but is not limited to, sinus headache, migraine, viral syndrome, giant cell arteritis, intracranial hemorrhage, mass, pseudotumor cerebri  Patient's presentation is most consistent with acute illness requiring diagnostic workup.   44 year old female presenting to the emergency department for treatment and evaluation of headache.  See HPI for further details.  Exam is reassuring.  She has no red flags with the exception of new onset headache not relieved with over-the-counter medication.  Head CT ordered.  Neurological exam is reassuring.  Toradol  ordered.  Clinical Course as of 11/29/24 9095  [place] Nov 29, 2024  0806 No improvement with Toradol . Fioricet  ordered. CT negative for acute findings. [CT]  [id number] Patient reports some improvement after Fioricet .  She also tells me that she believes that she had a low-grade fever and is concerned that she has a sinus infection since she is just recovering from COVID and feels the significant sinus pressure.  She will be discharged home with a prescription for amoxicillin  and Naprosyn .  Outpatient follow-up encouraged and ER return precautions discussed. [CT]    Clinical Course User Index [CT] Katieann Hungate B, FNP     FINAL CLINICAL IMPRESSION(S) / ED DIAGNOSES   Final diagnoses:  Sinus  headache     Rx / DC Orders   ED Discharge Orders          Ordered    amoxicillin  (AMOXIL ) 500 MG tablet  3 times daily        11/29/24 0854    naproxen  (NAPROSYN ) 500 MG tablet  2 times daily with meals        11/29/24 0854    fluconazole  (DIFLUCAN ) 150 MG tablet  Daily        11/29/24 0854             Note:  This document was prepared using Dragon voice recognition software and may include unintentional dictation errors.    Herlinda Kirk NOVAK, FNP 11/29/24 0904    Fernand Rossie HERO, MD 11/29/24 1530  "

## 2024-11-29 NOTE — Discharge Instructions (Signed)
 Take medication as prescribed and the antibiotic until finished.  If your symptoms are not improving over the next 2 to 3 days, please see your primary care provider.  If symptoms change or worsen and you are unable to schedule an appointment, please return to the emergency department.

## 2024-11-29 NOTE — ED Triage Notes (Signed)
 Pt in with sharp R temporal HA that began yesterday, woke up with the same HA and it has increasingly worsened since. Pt nauseous, +photophobia reported. Pt took Dayquil PTA

## 2024-12-10 ENCOUNTER — Ambulatory Visit (INDEPENDENT_AMBULATORY_CARE_PROVIDER_SITE_OTHER): Admitting: Urology

## 2024-12-10 VITALS — BP 119/83 | Wt 230.0 lb

## 2024-12-10 DIAGNOSIS — R10A2 Flank pain, left side: Secondary | ICD-10-CM | POA: Diagnosis not present

## 2024-12-10 NOTE — Progress Notes (Signed)
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  Most recent imaging CT from 2022 shows no hydronephrosis Last seen 2021 by [facility] urology for routine follow-up  Physical Exam: BP 119/83 (BP Location: Left Arm, Patient Position: Sitting, Cuff Size: Large)   Wt 230 lb (104.3 kg)   SpO2 96%   BMI 36.02 kg/m   Imaging/labs: CT stone protocol January 2022 with no hydronephrosis or urologic abnormalities Creatinine November 2025 normal at 0.79, eGFR greater than 60  Today: Reports about 4 to 6 weeks of intermittent left-sided back pain, also having some mild right sided back pain.  Seems to be worse with standing.  No alleviating factors Mild urinary frequency but no significant urinary symptoms  Plan:   Flank pain: With her history of ureteral reimplant on that side I recommended renal ultrasound for further evaluation.  If renal ultrasound benign likely MSK in etiology and would recommend PCP follow-up Renal ultrasound, call with results   Mary JAYSON Burnet, MD  [facility] [address], [address] [place], [place] [address] [phone]  "

## 2024-12-10 NOTE — Patient Instructions (Signed)
 Please call [phone] or [phone] to schedule your imaging prior to your appointment.

## 2024-12-17 ENCOUNTER — Other Ambulatory Visit: Payer: Self-pay

## 2024-12-17 ENCOUNTER — Ambulatory Visit
Admission: RE | Admit: 2024-12-17 | Discharge: 2024-12-17 | Disposition: A | Discharge status: Home / Self Care | Source: Ambulatory Visit | Attending: Urology | Admitting: Urology

## 2024-12-17 ENCOUNTER — Emergency Department
Admission: EM | Admit: 2024-12-17 | Discharge: 2024-12-17 | Disposition: A | Discharge status: Home / Self Care | Attending: Emergency Medicine | Admitting: Emergency Medicine

## 2024-12-17 DIAGNOSIS — J45909 Unspecified asthma, uncomplicated: Secondary | ICD-10-CM | POA: Insufficient documentation

## 2024-12-17 DIAGNOSIS — M546 Pain in thoracic spine: Secondary | ICD-10-CM | POA: Diagnosis not present

## 2024-12-17 DIAGNOSIS — G8929 Other chronic pain: Secondary | ICD-10-CM | POA: Diagnosis not present

## 2024-12-17 DIAGNOSIS — R10A2 Flank pain, left side: Secondary | ICD-10-CM | POA: Diagnosis present

## 2024-12-17 DIAGNOSIS — M25512 Pain in left shoulder: Secondary | ICD-10-CM | POA: Diagnosis present

## 2024-12-17 DIAGNOSIS — I10 Essential (primary) hypertension: Secondary | ICD-10-CM | POA: Insufficient documentation

## 2024-12-17 MED ORDER — MELOXICAM 15 MG PO TABS: 15.0000 mg | ORAL_TABLET | Freq: Every day | ORAL | 0 refills | Status: AC

## 2024-12-17 MED ORDER — CYCLOBENZAPRINE HCL 10 MG PO TABS: 10.0000 mg | ORAL_TABLET | Freq: Three times a day (TID) | ORAL | 0 refills | Status: AC | PRN

## 2024-12-17 NOTE — ED Triage Notes (Signed)
 Pt comes in via pov with complaint of left upper shoulder and back pain. Pt states that she had an injury at work over 6 months ago, and she is still experiencing pain. Pt has been taking gabapentin  for pain with no relief. Pt complains of pain 9/10. Last doe of gabapentin  was this morning.

## 2024-12-17 NOTE — ED Provider Notes (Signed)
 "  [facility] Provider Note    Event Date/Time   First MD Initiated Contact with Patient 12/17/24 1339     (approximate)   History   Back Pain   HPI  Mary Hicks is a 44 y.o. female history of hypertension, asthma, anxiety presents emergency department with continued left shoulder and upper back pain.  Had an injury about 6 months ago when she was doing repetitive movements with sweeping at work.  Was seen by [employer]. but then canceled.  Is complaining of pain at 9/10.  States oxycodone  is the only thing that has been helping.  Is not taking the anti-inflammatory, states she is allergic to tramadol, and has not been taking any muscle relaxers as she ran out.      Physical Exam   Triage Vital Signs: ED Triage Vitals [12/17/24 1225]  Encounter Vitals Group     BP (!) 154/105     Girls Systolic BP Percentile      Girls Diastolic BP Percentile      Boys Systolic BP Percentile      Boys Diastolic BP Percentile      Pulse Rate 61     Resp 17     Temp 98 F (36.7 C)     Temp src      SpO2 100 %     Weight 231 lb (104.8 kg)     Height 5' 7 (1.702 m)     Head Circumference      Peak Flow      Pain Score 9     Pain Loc      Pain Education      Exclude from Growth Chart     Most recent vital signs: Vitals:   12/17/24 1225  BP: (!) 154/105  Pulse: 61  Resp: 17  Temp: 98 F (36.7 C)  SpO2: 100%     General: Awake, no distress.   CV:  Good peripheral perfusion.  Resp:  Normal effort.  Abd:  No distention.   Other:  At the left shoulder the area near the levator scapula, and the bursa are tender to palpation, patient does have full range of motion, neurovascular intact   ED Results / Procedures / Treatments   Labs (all labs ordered are listed, but only abnormal results are displayed) Labs Reviewed - No data to display   EKG     RADIOLOGY     PROCEDURES:   Procedures  Critical Care:  no Chief Complaint   Patient presents with   Back Pain      MEDICATIONS ORDERED IN ED: Medications - No data to display   IMPRESSION / MDM / ASSESSMENT AND PLAN / ED COURSE  I reviewed the triage vital signs and the nursing notes.                              Differential diagnosis includes, but is not limited to, muscle strain, bursitis, brachial plexus injury  Patient's presentation is most consistent with acute illness / injury with system symptoms.   I did explain to the patient that we do not refill narcotic pain medication.  She was seen here previously and was given oxycodone .  Explained to her we only give 1 prescription of pain medicine for chronic pain.  At this time I feel that a meloxicam , Flexeril , and actually did offer her tramadol but then she informing she is allergic  to tramadol.  At this time I told her the meloxicam  and Flexeril  are all that we have to offer to her.  Her PDMP score is over 400.  Therefore I do not feel it is appropriate to reissue narcotics to this patient.  She is to follow-up with either orthopedics, her regular doctor, or see if she can go to the pain clinic.  Patient is a little disgruntled.  Discharged stable condition      FINAL CLINICAL IMPRESSION(S) / ED DIAGNOSES   Final diagnoses:  Other chronic pain     Rx / DC Orders   ED Discharge Orders          Ordered    meloxicam  (MOBIC ) 15 MG tablet  Daily        12/17/24 1429    cyclobenzaprine  (FLEXERIL ) 10 MG tablet  3 times daily PRN        12/17/24 1429             Note:  This document was prepared using Dragon voice recognition software and may include unintentional dictation errors.    Gasper Devere ORN, PA-C 12/17/24 1512    Ernest Ronal BRAVO, MD 12/18/24 [phone]  "

## 2024-12-21 ENCOUNTER — Ambulatory Visit: Payer: Self-pay | Admitting: Urology
